# Patient Record
Sex: Male | Born: 1945 | Race: White | Hispanic: No | Marital: Married | Smoking: Never smoker
Health system: Southern US, Community
[De-identification: ages and names within clinical notes are randomized; demographics above are authoritative.]

## PROBLEM LIST (undated history)

## (undated) DIAGNOSIS — R0602 Shortness of breath: Secondary | ICD-10-CM

## (undated) DIAGNOSIS — I77810 Thoracic aortic ectasia: Secondary | ICD-10-CM

## (undated) DIAGNOSIS — N400 Enlarged prostate without lower urinary tract symptoms: Secondary | ICD-10-CM

## (undated) DIAGNOSIS — R972 Elevated prostate specific antigen [PSA]: Secondary | ICD-10-CM

## (undated) DIAGNOSIS — E119 Type 2 diabetes mellitus without complications: Secondary | ICD-10-CM

## (undated) DIAGNOSIS — M109 Gout, unspecified: Secondary | ICD-10-CM

## (undated) DIAGNOSIS — R161 Splenomegaly, not elsewhere classified: Secondary | ICD-10-CM

## (undated) DIAGNOSIS — D500 Iron deficiency anemia secondary to blood loss (chronic): Secondary | ICD-10-CM

## (undated) DIAGNOSIS — I639 Cerebral infarction, unspecified: Secondary | ICD-10-CM

## (undated) DIAGNOSIS — Z8673 Personal history of transient ischemic attack (TIA), and cerebral infarction without residual deficits: Secondary | ICD-10-CM

## (undated) DIAGNOSIS — K219 Gastro-esophageal reflux disease without esophagitis: Secondary | ICD-10-CM

## (undated) DIAGNOSIS — M1990 Unspecified osteoarthritis, unspecified site: Secondary | ICD-10-CM

## (undated) DIAGNOSIS — K5900 Constipation, unspecified: Secondary | ICD-10-CM

## (undated) DIAGNOSIS — I10 Essential (primary) hypertension: Secondary | ICD-10-CM

## (undated) DIAGNOSIS — D75839 Thrombocytosis, unspecified: Secondary | ICD-10-CM

## (undated) DIAGNOSIS — E785 Hyperlipidemia, unspecified: Secondary | ICD-10-CM

## (undated) DIAGNOSIS — N529 Male erectile dysfunction, unspecified: Secondary | ICD-10-CM

## (undated) DIAGNOSIS — B370 Candidal stomatitis: Secondary | ICD-10-CM

## (undated) DIAGNOSIS — R739 Hyperglycemia, unspecified: Secondary | ICD-10-CM

## (undated) DIAGNOSIS — R7989 Other specified abnormal findings of blood chemistry: Secondary | ICD-10-CM

## (undated) DIAGNOSIS — Z8739 Personal history of other diseases of the musculoskeletal system and connective tissue: Secondary | ICD-10-CM

## (undated) HISTORY — DX: Personal history of transient ischemic attack (TIA), and cerebral infarction without residual deficits: Z86.73

## (undated) HISTORY — DX: Type 2 diabetes mellitus without complications: E11.9

## (undated) HISTORY — DX: Thoracic aortic ectasia: I77.810

## (undated) HISTORY — DX: Thrombocytosis, unspecified: D75.839

## (undated) HISTORY — DX: Hyperglycemia, unspecified: R73.9

## (undated) HISTORY — DX: Elevated prostate specific antigen (PSA): R97.20

## (undated) HISTORY — DX: Benign prostatic hyperplasia without lower urinary tract symptoms: N40.0

## (undated) HISTORY — DX: Candidal stomatitis: B37.0

## (undated) HISTORY — DX: Hyperlipidemia, unspecified: E78.5

## (undated) HISTORY — DX: Iron deficiency anemia secondary to blood loss (chronic): D50.0

## (undated) HISTORY — PX: INGUINAL HERNIA REPAIR: SUR1180

## (undated) HISTORY — PX: OTHER SURGICAL HISTORY: SHX169

## (undated) HISTORY — DX: Gout, unspecified: M10.9

## (undated) HISTORY — DX: Splenomegaly, not elsewhere classified: R16.1

## (undated) HISTORY — DX: Male erectile dysfunction, unspecified: N52.9

---

## 2009-07-19 HISTORY — PX: COLOSTOMY: SHX63

## 2010-07-19 HISTORY — PX: COLOSTOMY REVERSAL: SHX5782

## 2011-12-10 DIAGNOSIS — E669 Obesity, unspecified: Secondary | ICD-10-CM | POA: Diagnosis not present

## 2012-01-03 DIAGNOSIS — K432 Incisional hernia without obstruction or gangrene: Secondary | ICD-10-CM | POA: Diagnosis not present

## 2012-01-03 DIAGNOSIS — K602 Anal fissure, unspecified: Secondary | ICD-10-CM | POA: Diagnosis not present

## 2012-01-24 DIAGNOSIS — K432 Incisional hernia without obstruction or gangrene: Secondary | ICD-10-CM | POA: Diagnosis not present

## 2012-01-24 DIAGNOSIS — E669 Obesity, unspecified: Secondary | ICD-10-CM | POA: Diagnosis not present

## 2012-01-26 DIAGNOSIS — K430 Incisional hernia with obstruction, without gangrene: Secondary | ICD-10-CM | POA: Diagnosis not present

## 2012-01-26 DIAGNOSIS — N400 Enlarged prostate without lower urinary tract symptoms: Secondary | ICD-10-CM | POA: Diagnosis not present

## 2012-01-26 DIAGNOSIS — I10 Essential (primary) hypertension: Secondary | ICD-10-CM | POA: Diagnosis not present

## 2012-01-26 DIAGNOSIS — Z79899 Other long term (current) drug therapy: Secondary | ICD-10-CM | POA: Diagnosis not present

## 2012-01-26 DIAGNOSIS — K432 Incisional hernia without obstruction or gangrene: Secondary | ICD-10-CM | POA: Diagnosis not present

## 2012-01-26 DIAGNOSIS — E669 Obesity, unspecified: Secondary | ICD-10-CM | POA: Diagnosis not present

## 2012-01-26 DIAGNOSIS — Z9852 Vasectomy status: Secondary | ICD-10-CM | POA: Diagnosis not present

## 2012-05-19 DIAGNOSIS — M109 Gout, unspecified: Secondary | ICD-10-CM | POA: Diagnosis not present

## 2012-05-19 DIAGNOSIS — I10 Essential (primary) hypertension: Secondary | ICD-10-CM | POA: Diagnosis not present

## 2012-05-19 DIAGNOSIS — Z125 Encounter for screening for malignant neoplasm of prostate: Secondary | ICD-10-CM | POA: Diagnosis not present

## 2012-05-19 DIAGNOSIS — N529 Male erectile dysfunction, unspecified: Secondary | ICD-10-CM | POA: Diagnosis not present

## 2012-05-19 DIAGNOSIS — M25569 Pain in unspecified knee: Secondary | ICD-10-CM | POA: Diagnosis not present

## 2012-05-19 DIAGNOSIS — E785 Hyperlipidemia, unspecified: Secondary | ICD-10-CM | POA: Diagnosis not present

## 2012-05-19 DIAGNOSIS — K649 Unspecified hemorrhoids: Secondary | ICD-10-CM | POA: Diagnosis not present

## 2012-06-19 DIAGNOSIS — E291 Testicular hypofunction: Secondary | ICD-10-CM | POA: Diagnosis not present

## 2012-07-11 DIAGNOSIS — Z79899 Other long term (current) drug therapy: Secondary | ICD-10-CM | POA: Diagnosis not present

## 2012-07-11 DIAGNOSIS — Z23 Encounter for immunization: Secondary | ICD-10-CM | POA: Diagnosis not present

## 2012-07-11 DIAGNOSIS — Z8719 Personal history of other diseases of the digestive system: Secondary | ICD-10-CM | POA: Diagnosis not present

## 2012-07-11 DIAGNOSIS — M109 Gout, unspecified: Secondary | ICD-10-CM | POA: Diagnosis not present

## 2012-07-11 DIAGNOSIS — I6350 Cerebral infarction due to unspecified occlusion or stenosis of unspecified cerebral artery: Secondary | ICD-10-CM | POA: Diagnosis not present

## 2012-07-11 DIAGNOSIS — I369 Nonrheumatic tricuspid valve disorder, unspecified: Secondary | ICD-10-CM | POA: Diagnosis not present

## 2012-07-11 DIAGNOSIS — R5383 Other fatigue: Secondary | ICD-10-CM | POA: Diagnosis not present

## 2012-07-11 DIAGNOSIS — G459 Transient cerebral ischemic attack, unspecified: Secondary | ICD-10-CM | POA: Diagnosis not present

## 2012-07-11 DIAGNOSIS — I639 Cerebral infarction, unspecified: Secondary | ICD-10-CM

## 2012-07-11 DIAGNOSIS — M1710 Unilateral primary osteoarthritis, unspecified knee: Secondary | ICD-10-CM | POA: Diagnosis not present

## 2012-07-11 DIAGNOSIS — N400 Enlarged prostate without lower urinary tract symptoms: Secondary | ICD-10-CM | POA: Diagnosis not present

## 2012-07-11 DIAGNOSIS — Z9049 Acquired absence of other specified parts of digestive tract: Secondary | ICD-10-CM | POA: Diagnosis not present

## 2012-07-11 DIAGNOSIS — I10 Essential (primary) hypertension: Secondary | ICD-10-CM | POA: Diagnosis not present

## 2012-07-11 DIAGNOSIS — R809 Proteinuria, unspecified: Secondary | ICD-10-CM | POA: Diagnosis not present

## 2012-07-11 DIAGNOSIS — M1990 Unspecified osteoarthritis, unspecified site: Secondary | ICD-10-CM | POA: Diagnosis not present

## 2012-07-11 DIAGNOSIS — R5381 Other malaise: Secondary | ICD-10-CM | POA: Diagnosis not present

## 2012-07-11 DIAGNOSIS — I6330 Cerebral infarction due to thrombosis of unspecified cerebral artery: Secondary | ICD-10-CM | POA: Diagnosis not present

## 2012-07-11 DIAGNOSIS — Z7982 Long term (current) use of aspirin: Secondary | ICD-10-CM | POA: Diagnosis not present

## 2012-07-11 DIAGNOSIS — R209 Unspecified disturbances of skin sensation: Secondary | ICD-10-CM | POA: Diagnosis not present

## 2012-07-11 DIAGNOSIS — E7800 Pure hypercholesterolemia, unspecified: Secondary | ICD-10-CM | POA: Diagnosis not present

## 2012-07-11 HISTORY — DX: Cerebral infarction, unspecified: I63.9

## 2012-07-26 DIAGNOSIS — I10 Essential (primary) hypertension: Secondary | ICD-10-CM | POA: Diagnosis not present

## 2012-07-26 DIAGNOSIS — I6350 Cerebral infarction due to unspecified occlusion or stenosis of unspecified cerebral artery: Secondary | ICD-10-CM | POA: Diagnosis not present

## 2012-07-26 DIAGNOSIS — E291 Testicular hypofunction: Secondary | ICD-10-CM | POA: Diagnosis not present

## 2012-07-26 DIAGNOSIS — Z23 Encounter for immunization: Secondary | ICD-10-CM | POA: Diagnosis not present

## 2012-07-26 DIAGNOSIS — Z6836 Body mass index (BMI) 36.0-36.9, adult: Secondary | ICD-10-CM | POA: Diagnosis not present

## 2012-09-06 DIAGNOSIS — M999 Biomechanical lesion, unspecified: Secondary | ICD-10-CM | POA: Diagnosis not present

## 2012-09-07 DIAGNOSIS — M999 Biomechanical lesion, unspecified: Secondary | ICD-10-CM | POA: Diagnosis not present

## 2012-09-11 DIAGNOSIS — M999 Biomechanical lesion, unspecified: Secondary | ICD-10-CM | POA: Diagnosis not present

## 2012-09-12 DIAGNOSIS — M1710 Unilateral primary osteoarthritis, unspecified knee: Secondary | ICD-10-CM | POA: Diagnosis not present

## 2012-09-12 DIAGNOSIS — M999 Biomechanical lesion, unspecified: Secondary | ICD-10-CM | POA: Diagnosis not present

## 2012-09-14 DIAGNOSIS — M999 Biomechanical lesion, unspecified: Secondary | ICD-10-CM | POA: Diagnosis not present

## 2012-10-19 DIAGNOSIS — I10 Essential (primary) hypertension: Secondary | ICD-10-CM | POA: Diagnosis not present

## 2012-10-19 DIAGNOSIS — N401 Enlarged prostate with lower urinary tract symptoms: Secondary | ICD-10-CM | POA: Diagnosis not present

## 2012-10-19 DIAGNOSIS — E785 Hyperlipidemia, unspecified: Secondary | ICD-10-CM | POA: Diagnosis not present

## 2012-10-19 DIAGNOSIS — E039 Hypothyroidism, unspecified: Secondary | ICD-10-CM | POA: Diagnosis not present

## 2012-10-24 DIAGNOSIS — M1710 Unilateral primary osteoarthritis, unspecified knee: Secondary | ICD-10-CM | POA: Diagnosis not present

## 2013-02-02 DIAGNOSIS — N529 Male erectile dysfunction, unspecified: Secondary | ICD-10-CM | POA: Diagnosis not present

## 2013-02-02 DIAGNOSIS — M25569 Pain in unspecified knee: Secondary | ICD-10-CM | POA: Diagnosis not present

## 2013-02-02 DIAGNOSIS — N401 Enlarged prostate with lower urinary tract symptoms: Secondary | ICD-10-CM | POA: Diagnosis not present

## 2013-02-02 DIAGNOSIS — I10 Essential (primary) hypertension: Secondary | ICD-10-CM | POA: Diagnosis not present

## 2013-02-02 DIAGNOSIS — E785 Hyperlipidemia, unspecified: Secondary | ICD-10-CM | POA: Diagnosis not present

## 2013-05-07 DIAGNOSIS — Z23 Encounter for immunization: Secondary | ICD-10-CM | POA: Diagnosis not present

## 2013-05-07 DIAGNOSIS — I6350 Cerebral infarction due to unspecified occlusion or stenosis of unspecified cerebral artery: Secondary | ICD-10-CM | POA: Diagnosis not present

## 2013-05-07 DIAGNOSIS — E785 Hyperlipidemia, unspecified: Secondary | ICD-10-CM | POA: Diagnosis not present

## 2013-05-07 DIAGNOSIS — I10 Essential (primary) hypertension: Secondary | ICD-10-CM | POA: Diagnosis not present

## 2013-05-07 DIAGNOSIS — E038 Other specified hypothyroidism: Secondary | ICD-10-CM | POA: Diagnosis not present

## 2013-06-27 DIAGNOSIS — M1610 Unilateral primary osteoarthritis, unspecified hip: Secondary | ICD-10-CM | POA: Diagnosis not present

## 2013-06-27 DIAGNOSIS — M1710 Unilateral primary osteoarthritis, unspecified knee: Secondary | ICD-10-CM | POA: Diagnosis not present

## 2013-06-27 DIAGNOSIS — M169 Osteoarthritis of hip, unspecified: Secondary | ICD-10-CM | POA: Diagnosis not present

## 2013-06-27 DIAGNOSIS — IMO0002 Reserved for concepts with insufficient information to code with codable children: Secondary | ICD-10-CM | POA: Diagnosis not present

## 2013-07-24 DIAGNOSIS — L820 Inflamed seborrheic keratosis: Secondary | ICD-10-CM | POA: Diagnosis not present

## 2013-07-31 DIAGNOSIS — E785 Hyperlipidemia, unspecified: Secondary | ICD-10-CM | POA: Diagnosis not present

## 2013-07-31 DIAGNOSIS — M1610 Unilateral primary osteoarthritis, unspecified hip: Secondary | ICD-10-CM | POA: Diagnosis not present

## 2013-07-31 DIAGNOSIS — Z01818 Encounter for other preprocedural examination: Secondary | ICD-10-CM | POA: Diagnosis not present

## 2013-07-31 DIAGNOSIS — I10 Essential (primary) hypertension: Secondary | ICD-10-CM | POA: Diagnosis not present

## 2013-08-06 ENCOUNTER — Encounter (HOSPITAL_COMMUNITY): Payer: Self-pay | Admitting: Pharmacy Technician

## 2013-08-08 ENCOUNTER — Encounter (HOSPITAL_COMMUNITY)
Admission: RE | Admit: 2013-08-08 | Discharge: 2013-08-08 | Disposition: A | Discharge status: Home / Self Care | Payer: Medicare Other | Source: Ambulatory Visit | Attending: Orthopedic Surgery | Admitting: Orthopedic Surgery

## 2013-08-08 ENCOUNTER — Ambulatory Visit (HOSPITAL_COMMUNITY)
Admission: RE | Admit: 2013-08-08 | Discharge: 2013-08-08 | Disposition: A | Discharge status: Home / Self Care | Payer: Medicare Other | Source: Ambulatory Visit | Attending: Orthopedic Surgery | Admitting: Orthopedic Surgery

## 2013-08-08 ENCOUNTER — Encounter (HOSPITAL_COMMUNITY): Payer: Self-pay

## 2013-08-08 DIAGNOSIS — M1610 Unilateral primary osteoarthritis, unspecified hip: Secondary | ICD-10-CM | POA: Diagnosis not present

## 2013-08-08 DIAGNOSIS — R911 Solitary pulmonary nodule: Secondary | ICD-10-CM | POA: Diagnosis not present

## 2013-08-08 DIAGNOSIS — M47814 Spondylosis without myelopathy or radiculopathy, thoracic region: Secondary | ICD-10-CM | POA: Diagnosis not present

## 2013-08-08 DIAGNOSIS — Z0183 Encounter for blood typing: Secondary | ICD-10-CM | POA: Insufficient documentation

## 2013-08-08 DIAGNOSIS — Z01818 Encounter for other preprocedural examination: Secondary | ICD-10-CM | POA: Diagnosis not present

## 2013-08-08 DIAGNOSIS — M169 Osteoarthritis of hip, unspecified: Secondary | ICD-10-CM | POA: Diagnosis not present

## 2013-08-08 DIAGNOSIS — Z01812 Encounter for preprocedural laboratory examination: Secondary | ICD-10-CM | POA: Insufficient documentation

## 2013-08-08 DIAGNOSIS — J8410 Pulmonary fibrosis, unspecified: Secondary | ICD-10-CM | POA: Diagnosis not present

## 2013-08-08 HISTORY — DX: Unspecified osteoarthritis, unspecified site: M19.90

## 2013-08-08 HISTORY — DX: Benign prostatic hyperplasia without lower urinary tract symptoms: N40.0

## 2013-08-08 HISTORY — DX: Personal history of other diseases of the musculoskeletal system and connective tissue: Z87.39

## 2013-08-08 HISTORY — DX: Other specified abnormal findings of blood chemistry: R79.89

## 2013-08-08 HISTORY — DX: Constipation, unspecified: K59.00

## 2013-08-08 HISTORY — DX: Shortness of breath: R06.02

## 2013-08-08 HISTORY — DX: Cerebral infarction, unspecified: I63.9

## 2013-08-08 HISTORY — DX: Gastro-esophageal reflux disease without esophagitis: K21.9

## 2013-08-08 HISTORY — DX: Essential (primary) hypertension: I10

## 2013-08-08 LAB — ABO/RH: ABO/RH(D): O NEG

## 2013-08-08 LAB — SURGICAL PCR SCREEN
MRSA, PCR: NEGATIVE
Staphylococcus aureus: NEGATIVE

## 2013-08-08 LAB — PROTIME-INR
INR: 0.92 (ref 0.00–1.49)
Prothrombin Time: 12.2 seconds (ref 11.6–15.2)

## 2013-08-08 LAB — APTT: aPTT: 28 seconds (ref 24–37)

## 2013-08-08 NOTE — Progress Notes (Signed)
CBC / BMET / UA / EKG on chart from [facility].

## 2013-08-08 NOTE — Patient Instructions (Addendum)
William [name]  08/08/2013                           YOUR PROCEDURE IS SCHEDULED ON: 08/14/13               PLEASE REPORT TO SHORT STAY CENTER AT : 12:00 PM               CALL THIS NUMBER IF ANY PROBLEMS THE DAY OF SURGERY :               [phone]                      REMEMBER:   Do not eat food or drink liquids AFTER MIDNIGHT  May have clear liquids UNTIL 6 HOURS BEFORE SURGERY (9:00 AM)  Clear liquids include soda, tea, black coffee, apple or grape juice, broth.  Take these medicines the morning of surgery with A SIP OF WATER:  AMLODIPINE / METOPROLOL   Do not wear jewelry, make-up   Do not wear lotions, powders, or perfumes.   Do not shave legs or underarms 12 hrs. before surgery (men may shave face)  Do not bring valuables to the hospital.  Contacts, dentures or bridgework may not be worn into surgery.  Leave suitcase in the car. After surgery it may be brought to your room.  For patients admitted to the hospital more than one night, checkout time is 11:00                          The day of discharge.   Patients discharged the day of surgery will not be allowed to drive home                             If going home same day of surgery, must have someone stay with you first                           24 hrs at home and arrange for some one to drive you home from hospital.    Special Instructions:   Please read over the following fact sheets that you were given:               1. MRSA  INFORMATION                      2. [place]               3. [place]                                                X_____________________________________________________________________        Failure to follow these instructions may result in cancellation of your surgery

## 2013-08-08 NOTE — Progress Notes (Signed)
08/08/13 1123  OBSTRUCTIVE SLEEP APNEA  Have you ever been diagnosed with sleep apnea through a sleep study? No  Do you snore loudly (loud enough to be heard through closed doors)?  1  Do you often feel tired, fatigued, or sleepy during the daytime? 1  Has anyone observed you stop breathing during your sleep? 1  Do you have, or are you being treated for high blood pressure? 1  BMI more than 35 kg/m2? 1  Age over 67 years old? 1  Neck circumference greater than 40 cm/18 inches? 0  Gender: 1  Obstructive Sleep Apnea Score 7  Score 4 or greater  Results sent to PCP

## 2013-08-08 NOTE — H&P (Signed)
TOTAL HIP ADMISSION H&P  Patient is admitted for right total hip arthroplasty, anterior approach.  Subjective:  Chief Complaint:      Right hip OA / pain  HPI: William [name], 68 y.o. male, has a history of pain and functional disability in the right hip(s) due to arthritis and patient has failed non-surgical conservative treatments for greater than 12 weeks to include NSAID's and/or analgesics, use of assistive devices and activity modification.  Onset of symptoms was gradual starting 1+ years ago with gradually worsening course since that time.The patient noted no past surgery on the right hip(s).  Patient currently rates pain in the right hip at 7 out of 10 with activity. Patient has worsening of pain with activity and weight bearing, trendelenberg gait, pain that interfers with activities of daily living, pain with passive range of motion and crepitus. Patient has evidence of periarticular osteophytes and joint space narrowing by imaging studies. This condition presents safety issues increasing the risk of falls. There is no current active infection.  Risks, benefits and expectations were discussed with the patient.  Risks including but not limited to the risk of anesthesia, blood clots, nerve damage, blood vessel damage, failure of the prosthesis, infection and up to and including death.  Patient understand the risks, benefits and expectations and wishes to proceed with surgery.   D/C Plans:   Home with HHPT  Post-op Meds:     No Rx given   Tranexamic Acid:     Not to be given - CAD  Decadron:    To be given  FYI:    ASA post-op  Norco post-op    Past Medical History  Diagnosis Date  . Hypertension   . Shortness of breath     WITH EXERTION  . 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 Arthritis   . History of gout   . GERD (gastroesophageal reflux disease)   . Constipation   . BPH (benign prostatic hyperplasia)   . Low testosterone     Past  Surgical History  Procedure Laterality Date  . 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 Colostomy reversal  2012  . Inghernia    . Inguinal hernia repair      No prescriptions prior to admission   No Known Allergies   History  Substance Use Topics  . Smoking status: Never Smoker   . Smokeless tobacco: Not on file  . Alcohol Use: No    No family history on file.   Review of Systems  Constitutional: Negative.   HENT: Negative.   Eyes: Negative.   Respiratory: Positive for shortness of breath (on exertion).   Cardiovascular: Negative.   Gastrointestinal: Positive for heartburn and constipation.  Genitourinary: Negative.   Musculoskeletal: Positive for joint pain.  Skin: Negative.   Neurological: Negative.   Endo/Heme/Allergies: Negative.   Psychiatric/Behavioral: Negative.     Objective:  Physical Exam  Constitutional: He is oriented to person, place, and time. He appears well-developed and well-nourished.  HENT:  Head: Normocephalic and atraumatic.  Mouth/Throat: Oropharynx is clear and moist.  Eyes: Pupils are equal, round, and reactive to light.  Neck: Neck supple. No JVD present. No tracheal deviation present. No thyromegaly present.  Cardiovascular: Normal rate, regular rhythm, normal heart sounds and intact distal pulses.   Respiratory: Effort normal and breath sounds normal. No stridor. No respiratory distress. He has no wheezes.  GI: Soft. There is no  tenderness. There is no guarding.  Musculoskeletal:       Right hip: He exhibits decreased range of motion, decreased strength, tenderness and bony tenderness. He exhibits no swelling, no deformity and no laceration.  Lymphadenopathy:    He has no cervical adenopathy.  Neurological: He is alert and oriented to person, place, and time.  Skin: Skin is warm and dry.  Psychiatric: He has a normal mood and affect.    Vital signs in last 24 hours: Temp:  [97.6 F (36.4 C)] 97.6 F (36.4 C) (01/21 1103) Pulse  Rate:  [67] 67 (01/21 1103) Resp:  [16] 16 (01/21 1103) BP: (140)/(86) 140/86 mmHg (01/21 1103) SpO2:  [95 %] 95 % (01/21 1103) Weight:  [90.266 kg (199 lb)] 90.266 kg (199 lb) (01/21 1103)   Imaging Review Plain radiographs demonstrate severe degenerative joint disease of the right hip(s). The bone quality appears to be good for age and reported activity level.  Assessment/Plan:  End stage arthritis, right hip(s)  The patient history, physical examination, clinical judgement of the provider and imaging studies are consistent with end stage degenerative joint disease of the right hip(s) and total hip arthroplasty is deemed medically necessary. The treatment options including medical management, injection therapy, arthroscopy and arthroplasty were discussed at length. The risks and benefits of total hip arthroplasty were presented and reviewed. The risks due to aseptic loosening, infection, stiffness, dislocation/subluxation,  thromboembolic complications and other imponderables were discussed.  The patient acknowledged the explanation, agreed to proceed with the plan and consent was signed. Patient is being admitted for inpatient treatment for surgery, pain control, PT, OT, prophylactic antibiotics, VTE prophylaxis, progressive ambulation and ADL's and discharge planning.The patient is planning to be discharged home with home health services.    [place] Pugh Bird Swetz   PAC  08/08/2013, 3:31 PM

## 2013-08-14 ENCOUNTER — Encounter (HOSPITAL_COMMUNITY): Payer: Medicare Other | Admitting: Registered Nurse

## 2013-08-14 ENCOUNTER — Inpatient Hospital Stay (HOSPITAL_COMMUNITY): Payer: Medicare Other

## 2013-08-14 ENCOUNTER — Encounter (HOSPITAL_COMMUNITY)
Admission: RE | Disposition: A | Discharge status: Home Health | Payer: Self-pay | Source: Ambulatory Visit | Attending: Orthopedic Surgery

## 2013-08-14 ENCOUNTER — Inpatient Hospital Stay (HOSPITAL_COMMUNITY): Payer: Medicare Other | Admitting: Registered Nurse

## 2013-08-14 ENCOUNTER — Inpatient Hospital Stay (HOSPITAL_COMMUNITY)
Admission: RE | Admit: 2013-08-14 | Discharge: 2013-08-15 | DRG: 470 | Disposition: A | Discharge status: Home Health | Payer: Medicare Other | Source: Ambulatory Visit | Attending: Orthopedic Surgery | Admitting: Orthopedic Surgery

## 2013-08-14 ENCOUNTER — Encounter (HOSPITAL_COMMUNITY): Payer: Self-pay | Admitting: *Deleted

## 2013-08-14 DIAGNOSIS — E669 Obesity, unspecified: Secondary | ICD-10-CM | POA: Diagnosis not present

## 2013-08-14 DIAGNOSIS — M1610 Unilateral primary osteoarthritis, unspecified hip: Principal | ICD-10-CM | POA: Diagnosis present

## 2013-08-14 DIAGNOSIS — Z96649 Presence of unspecified artificial hip joint: Secondary | ICD-10-CM

## 2013-08-14 DIAGNOSIS — K219 Gastro-esophageal reflux disease without esophagitis: Secondary | ICD-10-CM | POA: Diagnosis not present

## 2013-08-14 DIAGNOSIS — M25559 Pain in unspecified hip: Secondary | ICD-10-CM | POA: Diagnosis not present

## 2013-08-14 DIAGNOSIS — I10 Essential (primary) hypertension: Secondary | ICD-10-CM | POA: Diagnosis not present

## 2013-08-14 DIAGNOSIS — D62 Acute posthemorrhagic anemia: Secondary | ICD-10-CM | POA: Diagnosis not present

## 2013-08-14 DIAGNOSIS — I2510 Atherosclerotic heart disease of native coronary artery without angina pectoris: Secondary | ICD-10-CM | POA: Diagnosis present

## 2013-08-14 DIAGNOSIS — I69998 Other sequelae following unspecified cerebrovascular disease: Secondary | ICD-10-CM

## 2013-08-14 DIAGNOSIS — Z6836 Body mass index (BMI) 36.0-36.9, adult: Secondary | ICD-10-CM

## 2013-08-14 DIAGNOSIS — M169 Osteoarthritis of hip, unspecified: Secondary | ICD-10-CM | POA: Diagnosis not present

## 2013-08-14 DIAGNOSIS — Z471 Aftercare following joint replacement surgery: Secondary | ICD-10-CM | POA: Diagnosis not present

## 2013-08-14 DIAGNOSIS — R209 Unspecified disturbances of skin sensation: Secondary | ICD-10-CM | POA: Diagnosis present

## 2013-08-14 DIAGNOSIS — R0602 Shortness of breath: Secondary | ICD-10-CM | POA: Diagnosis not present

## 2013-08-14 HISTORY — PX: TOTAL HIP ARTHROPLASTY: SHX124

## 2013-08-14 HISTORY — DX: Presence of unspecified artificial hip joint: Z96.649

## 2013-08-14 LAB — TYPE AND SCREEN
ABO/RH(D): O NEG
Antibody Screen: NEGATIVE

## 2013-08-14 SURGERY — ARTHROPLASTY, HIP, TOTAL, ANTERIOR APPROACH
Anesthesia: Spinal | Site: Hip | Laterality: Right

## 2013-08-14 MED ORDER — PHENYLEPHRINE HCL 10 MG/ML IJ SOLN
INTRAMUSCULAR | Status: DC | PRN
  Administered 2013-08-14 (×3): 120 ug via INTRAVENOUS

## 2013-08-14 MED ORDER — FERROUS SULFATE 325 (65 FE) MG PO TABS
325.0000 mg | ORAL_TABLET | Freq: Three times a day (TID) | ORAL | Status: DC
  Administered 2013-08-15 (×2): 325 mg via ORAL
  Filled 2013-08-14 (×5): qty 1

## 2013-08-14 MED ORDER — FENTANYL CITRATE 0.05 MG/ML IJ SOLN
INTRAMUSCULAR | Status: AC
  Filled 2013-08-14: qty 2

## 2013-08-14 MED ORDER — ONDANSETRON HCL 4 MG/2ML IJ SOLN: 4.0000 mg | Freq: Four times a day (QID) | INTRAMUSCULAR | Status: DC | PRN

## 2013-08-14 MED ORDER — CEFAZOLIN SODIUM-DEXTROSE 2-3 GM-% IV SOLR
2.0000 g | Freq: Four times a day (QID) | INTRAVENOUS | Status: AC
  Administered 2013-08-14 – 2013-08-15 (×2): 2 g via INTRAVENOUS
  Filled 2013-08-14 (×2): qty 50

## 2013-08-14 MED ORDER — DEXAMETHASONE SODIUM PHOSPHATE 10 MG/ML IJ SOLN
10.0000 mg | Freq: Once | INTRAMUSCULAR | Status: DC
  Filled 2013-08-14: qty 1

## 2013-08-14 MED ORDER — PNEUMOCOCCAL VAC POLYVALENT 25 MCG/0.5ML IJ INJ
0.5000 mL | INJECTION | INTRAMUSCULAR | Status: DC
  Filled 2013-08-14 (×2): qty 0.5

## 2013-08-14 MED ORDER — HYDROMORPHONE HCL PF 1 MG/ML IJ SOLN
0.2500 mg | INTRAMUSCULAR | Status: DC | PRN
  Administered 2013-08-14 (×4): 0.5 mg via INTRAVENOUS

## 2013-08-14 MED ORDER — 0.9 % SODIUM CHLORIDE (POUR BTL) OPTIME
TOPICAL | Status: DC | PRN
  Administered 2013-08-14: 1000 mL

## 2013-08-14 MED ORDER — PROMETHAZINE HCL 25 MG/ML IJ SOLN: 6.2500 mg | INTRAMUSCULAR | Status: DC | PRN

## 2013-08-14 MED ORDER — CHLORHEXIDINE GLUCONATE 4 % EX LIQD: 60.0000 mL | Freq: Once | CUTANEOUS | Status: DC

## 2013-08-14 MED ORDER — AMLODIPINE BESYLATE 10 MG PO TABS
10.0000 mg | ORAL_TABLET | Freq: Every morning | ORAL | Status: DC
  Filled 2013-08-14: qty 1

## 2013-08-14 MED ORDER — LACTATED RINGERS IV SOLN
INTRAVENOUS | Status: DC | PRN
  Administered 2013-08-14 (×2): via INTRAVENOUS

## 2013-08-14 MED ORDER — SODIUM CHLORIDE 0.9 % IV SOLN
100.0000 mL/h | INTRAVENOUS | Status: DC
  Administered 2013-08-14 – 2013-08-15 (×2): 100 mL/h via INTRAVENOUS
  Filled 2013-08-14 (×9): qty 1000

## 2013-08-14 MED ORDER — METHOCARBAMOL 100 MG/ML IJ SOLN
500.0000 mg | Freq: Four times a day (QID) | INTRAVENOUS | Status: DC | PRN
  Administered 2013-08-14: 500 mg via INTRAVENOUS
  Filled 2013-08-14: qty 5

## 2013-08-14 MED ORDER — PROPOFOL 10 MG/ML IV BOLUS
INTRAVENOUS | Status: AC
  Filled 2013-08-14: qty 20

## 2013-08-14 MED ORDER — CEFAZOLIN SODIUM-DEXTROSE 2-3 GM-% IV SOLR
2.0000 g | INTRAVENOUS | Status: AC
  Administered 2013-08-14: 2 g via INTRAVENOUS

## 2013-08-14 MED ORDER — METOCLOPRAMIDE HCL 5 MG/ML IJ SOLN: 5.0000 mg | Freq: Three times a day (TID) | INTRAMUSCULAR | Status: DC | PRN

## 2013-08-14 MED ORDER — GLYCOPYRROLATE 0.2 MG/ML IJ SOLN
INTRAMUSCULAR | Status: AC
  Filled 2013-08-14: qty 1

## 2013-08-14 MED ORDER — LACTATED RINGERS IV SOLN
INTRAVENOUS | Status: DC
  Administered 2013-08-14: 16:00:00 via INTRAVENOUS

## 2013-08-14 MED ORDER — PHENYLEPHRINE HCL 10 MG/ML IJ SOLN
20.0000 mg | INTRAVENOUS | Status: DC | PRN
  Administered 2013-08-14: 10 ug/min via INTRAVENOUS

## 2013-08-14 MED ORDER — HYDROCODONE-ACETAMINOPHEN 7.5-325 MG PO TABS
1.0000 | ORAL_TABLET | ORAL | Status: DC
  Administered 2013-08-14 – 2013-08-15 (×5): 2 via ORAL
  Filled 2013-08-14 (×5): qty 2

## 2013-08-14 MED ORDER — ALUM & MAG HYDROXIDE-SIMETH 200-200-20 MG/5ML PO SUSP: 30.0000 mL | ORAL | Status: DC | PRN

## 2013-08-14 MED ORDER — HYDROMORPHONE HCL PF 1 MG/ML IJ SOLN: 0.5000 mg | INTRAMUSCULAR | Status: DC | PRN

## 2013-08-14 MED ORDER — MENTHOL 3 MG MT LOZG: 1.0000 | LOZENGE | OROMUCOSAL | Status: DC | PRN

## 2013-08-14 MED ORDER — ONDANSETRON HCL 4 MG PO TABS: 4.0000 mg | ORAL_TABLET | Freq: Four times a day (QID) | ORAL | Status: DC | PRN

## 2013-08-14 MED ORDER — METOCLOPRAMIDE HCL 10 MG PO TABS
5.0000 mg | ORAL_TABLET | Freq: Three times a day (TID) | ORAL | Status: DC | PRN
Start: 2013-08-14 — End: 2013-08-15

## 2013-08-14 MED ORDER — SIMVASTATIN 20 MG PO TABS
20.0000 mg | ORAL_TABLET | Freq: Every day | ORAL | Status: DC
  Filled 2013-08-14: qty 1

## 2013-08-14 MED ORDER — ONDANSETRON HCL 4 MG/2ML IJ SOLN
INTRAMUSCULAR | Status: AC
  Filled 2013-08-14: qty 2

## 2013-08-14 MED ORDER — METOPROLOL SUCCINATE ER 100 MG PO TB24
100.0000 mg | ORAL_TABLET | Freq: Two times a day (BID) | ORAL | Status: DC
  Administered 2013-08-15: 100 mg via ORAL
  Filled 2013-08-14 (×3): qty 1

## 2013-08-14 MED ORDER — PHENOL 1.4 % MT LIQD: 1.0000 | OROMUCOSAL | Status: DC | PRN

## 2013-08-14 MED ORDER — CEFAZOLIN SODIUM-DEXTROSE 2-3 GM-% IV SOLR
INTRAVENOUS | Status: AC
  Filled 2013-08-14: qty 50

## 2013-08-14 MED ORDER — MIDAZOLAM HCL 5 MG/5ML IJ SOLN
INTRAMUSCULAR | Status: DC | PRN
  Administered 2013-08-14: 2 mg via INTRAVENOUS

## 2013-08-14 MED ORDER — LACTATED RINGERS IV SOLN
INTRAVENOUS | Status: DC
  Administered 2013-08-14: 1000 mL via INTRAVENOUS

## 2013-08-14 MED ORDER — ALLOPURINOL 300 MG PO TABS
300.0000 mg | ORAL_TABLET | Freq: Every day | ORAL | Status: DC
  Administered 2013-08-14 – 2013-08-15 (×2): 300 mg via ORAL
  Filled 2013-08-14 (×2): qty 1

## 2013-08-14 MED ORDER — BISACODYL 10 MG RE SUPP: 10.0000 mg | Freq: Every day | RECTAL | Status: DC | PRN

## 2013-08-14 MED ORDER — ZOLPIDEM TARTRATE 5 MG PO TABS: 5.0000 mg | ORAL_TABLET | Freq: Every evening | ORAL | Status: DC | PRN

## 2013-08-14 MED ORDER — HYDROMORPHONE HCL PF 1 MG/ML IJ SOLN
INTRAMUSCULAR | Status: AC
  Filled 2013-08-14: qty 1

## 2013-08-14 MED ORDER — GLYCOPYRROLATE 0.2 MG/ML IJ SOLN
INTRAMUSCULAR | Status: DC | PRN
  Administered 2013-08-14: 0.2 mg via INTRAVENOUS

## 2013-08-14 MED ORDER — DOCUSATE SODIUM 100 MG PO CAPS
100.0000 mg | ORAL_CAPSULE | Freq: Two times a day (BID) | ORAL | Status: DC
  Administered 2013-08-14 – 2013-08-15 (×2): 100 mg via ORAL

## 2013-08-14 MED ORDER — ASPIRIN EC 325 MG PO TBEC
325.0000 mg | DELAYED_RELEASE_TABLET | Freq: Two times a day (BID) | ORAL | Status: DC
  Administered 2013-08-15: 325 mg via ORAL
  Filled 2013-08-14 (×3): qty 1

## 2013-08-14 MED ORDER — CELECOXIB 200 MG PO CAPS
200.0000 mg | ORAL_CAPSULE | Freq: Two times a day (BID) | ORAL | Status: DC
  Administered 2013-08-14 – 2013-08-15 (×2): 200 mg via ORAL
  Filled 2013-08-14 (×3): qty 1

## 2013-08-14 MED ORDER — DIPHENHYDRAMINE HCL 25 MG PO CAPS: 25.0000 mg | ORAL_CAPSULE | Freq: Four times a day (QID) | ORAL | Status: DC | PRN

## 2013-08-14 MED ORDER — DEXAMETHASONE SODIUM PHOSPHATE 10 MG/ML IJ SOLN
INTRAMUSCULAR | Status: AC
  Filled 2013-08-14: qty 1

## 2013-08-14 MED ORDER — MIDAZOLAM HCL 2 MG/2ML IJ SOLN
INTRAMUSCULAR | Status: AC
  Filled 2013-08-14: qty 2

## 2013-08-14 MED ORDER — FLEET ENEMA 7-19 GM/118ML RE ENEM: 1.0000 | ENEMA | Freq: Once | RECTAL | Status: AC | PRN

## 2013-08-14 MED ORDER — MEPERIDINE HCL 50 MG/ML IJ SOLN: 6.2500 mg | INTRAMUSCULAR | Status: DC | PRN

## 2013-08-14 MED ORDER — ONDANSETRON HCL 4 MG/2ML IJ SOLN
INTRAMUSCULAR | Status: DC | PRN
  Administered 2013-08-14: 4 mg via INTRAVENOUS

## 2013-08-14 MED ORDER — FENTANYL CITRATE 0.05 MG/ML IJ SOLN
INTRAMUSCULAR | Status: DC | PRN
  Administered 2013-08-14: 100 ug via INTRAVENOUS

## 2013-08-14 MED ORDER — DEXAMETHASONE SODIUM PHOSPHATE 10 MG/ML IJ SOLN
10.0000 mg | Freq: Once | INTRAMUSCULAR | Status: AC
  Administered 2013-08-14: 10 mg via INTRAVENOUS

## 2013-08-14 MED ORDER — POLYETHYLENE GLYCOL 3350 17 G PO PACK
17.0000 g | PACK | Freq: Two times a day (BID) | ORAL | Status: DC
  Administered 2013-08-14 – 2013-08-15 (×2): 17 g via ORAL

## 2013-08-14 MED ORDER — PROPOFOL INFUSION 10 MG/ML OPTIME
INTRAVENOUS | Status: DC | PRN
  Administered 2013-08-14: 50 ug/kg/min via INTRAVENOUS

## 2013-08-14 MED ORDER — TAMSULOSIN HCL 0.4 MG PO CAPS
0.4000 mg | ORAL_CAPSULE | Freq: Every day | ORAL | Status: DC
  Administered 2013-08-14 – 2013-08-15 (×2): 0.4 mg via ORAL
  Filled 2013-08-14 (×2): qty 1

## 2013-08-14 MED ORDER — METHOCARBAMOL 500 MG PO TABS
500.0000 mg | ORAL_TABLET | Freq: Four times a day (QID) | ORAL | Status: DC | PRN
  Administered 2013-08-15 (×2): 500 mg via ORAL
  Filled 2013-08-14 (×2): qty 1

## 2013-08-14 SURGICAL SUPPLY — 41 items
BAG ZIPLOCK 12X15 (MISCELLANEOUS) IMPLANT
BLADE SAW SGTL 18X1.27X75 (BLADE) ×2 IMPLANT
BLADE SAW SGTL 18X1.27X75MM (BLADE) ×1
CAPT HIP PF COP ×3 IMPLANT
CATH FOLEY 2WAY  5CC 16FR SIL (CATHETERS) ×2
CATH FOLEY 2WAY 5CC 16FR SIL (CATHETERS) ×1 IMPLANT
DERMABOND ADVANCED (GAUZE/BANDAGES/DRESSINGS) ×2
DERMABOND ADVANCED .7 DNX12 (GAUZE/BANDAGES/DRESSINGS) ×1 IMPLANT
DRAPE C-ARM 42X120 X-RAY (DRAPES) ×3 IMPLANT
DRAPE POUCH INSTRU U-SHP 10X18 (DRAPES) IMPLANT
DRAPE STERI IOBAN 125X83 (DRAPES) ×3 IMPLANT
DRAPE U-SHAPE 47X51 STRL (DRAPES) ×9 IMPLANT
DRSG AQUACEL AG ADV 3.5X10 (GAUZE/BANDAGES/DRESSINGS) ×3 IMPLANT
DRSG TEGADERM 4X4.75 (GAUZE/BANDAGES/DRESSINGS) IMPLANT
DURAPREP 26ML APPLICATOR (WOUND CARE) ×3 IMPLANT
ELECT BLADE TIP CTD 4 INCH (ELECTRODE) ×3 IMPLANT
ELECT REM PT RETURN 9FT ADLT (ELECTROSURGICAL) ×3
ELECTRODE REM PT RTRN 9FT ADLT (ELECTROSURGICAL) ×1 IMPLANT
EVACUATOR 1/8 PVC DRAIN (DRAIN) IMPLANT
FACESHIELD LNG OPTICON STERILE (SAFETY) ×12 IMPLANT
GAUZE SPONGE 2X2 8PLY STRL LF (GAUZE/BANDAGES/DRESSINGS) IMPLANT
GLOVE BIOGEL PI IND STRL 7.5 (GLOVE) ×1 IMPLANT
GLOVE BIOGEL PI IND STRL 8 (GLOVE) ×1 IMPLANT
GLOVE BIOGEL PI INDICATOR 7.5 (GLOVE) ×2
GLOVE BIOGEL PI INDICATOR 8 (GLOVE) ×2
GLOVE ECLIPSE 8.0 STRL XLNG CF (GLOVE) ×3 IMPLANT
GLOVE ORTHO TXT STRL SZ7.5 (GLOVE) ×6 IMPLANT
GOWN SPEC L3 XXLG W/TWL (GOWN DISPOSABLE) ×3 IMPLANT
GOWN STRL REUS W/TWL LRG LVL3 (GOWN DISPOSABLE) ×3 IMPLANT
KIT BASIN OR (CUSTOM PROCEDURE TRAY) ×3 IMPLANT
PACK TOTAL JOINT (CUSTOM PROCEDURE TRAY) ×3 IMPLANT
PADDING CAST COTTON 6X4 STRL (CAST SUPPLIES) ×3 IMPLANT
SPONGE GAUZE 2X2 STER 10/PKG (GAUZE/BANDAGES/DRESSINGS)
SUT MNCRL AB 4-0 PS2 18 (SUTURE) ×3 IMPLANT
SUT VIC AB 1 CT1 36 (SUTURE) ×9 IMPLANT
SUT VIC AB 2-0 CT1 27 (SUTURE) ×6
SUT VIC AB 2-0 CT1 TAPERPNT 27 (SUTURE) ×3 IMPLANT
SUT VLOC 180 0 24IN GS25 (SUTURE) ×3 IMPLANT
TOWEL OR 17X26 10 PK STRL BLUE (TOWEL DISPOSABLE) ×3 IMPLANT
TOWEL OR NON WOVEN STRL DISP B (DISPOSABLE) IMPLANT
TRAY FOLEY CATH 14FRSI W/METER (CATHETERS) IMPLANT

## 2013-08-14 NOTE — Anesthesia Postprocedure Evaluation (Signed)
  Anesthesia Post-op Note  Patient: William [name]  Procedure(s) Performed: Procedure(s) (LRB): RIGHT TOTAL HIP ARTHROPLASTY ANTERIOR APPROACH (Right)  Patient Location: PACU  Anesthesia Type: Spinal  Level of Consciousness: awake and alert   Airway and Oxygen Therapy: Patient Spontanous Breathing  Post-op Pain: mild  Post-op Assessment: Post-op Vital signs reviewed, Patient's Cardiovascular Status Stable, Respiratory Function Stable, Patent Airway and No signs of Nausea or vomiting  Last Vitals:  Filed Vitals:   08/14/13 1730  BP: 103/68  Pulse: 54  Temp:   Resp: 10    Post-op Vital Signs: stable   Complications: No apparent anesthesia complications

## 2013-08-14 NOTE — Anesthesia Procedure Notes (Addendum)
Spinal   Spinal  End time: 08/14/2013 1:54 PM Staffing CRNA/Resident: Geneveive Furness E Preanesthetic Checklist Completed: patient identified, site marked, surgical consent, pre-op evaluation, timeout performed, IV checked, risks and benefits discussed and monitors and equipment checked Spinal Block Patient position: sitting Prep: Betadine Patient monitoring: continuous pulse ox and blood pressure Approach: midline Location: L3-4 Injection technique: single-shot Needle Needle gauge: 22 G Assessment Sensory level: T8 Additional Notes  Spinal kit expiration checked and within dated. Good CSF flow x3 . Negative heme and negative paresthesia, Pt tolerated spinal well.

## 2013-08-14 NOTE — Anesthesia Preprocedure Evaluation (Addendum)
Anesthesia Evaluation  Patient identified by MRN, date of birth, ID band Patient awake    Reviewed: Allergy & Precautions, H&P , NPO status , Patient's Chart, lab work & pertinent test results  Airway Mallampati: II TM Distance: >3 FB Neck ROM: Full    Dental no notable dental hx.    Pulmonary shortness of breath,  breath sounds clear to auscultation  Pulmonary exam normal       Cardiovascular Exercise Tolerance: Good hypertension, Pt. on medications and Pt. on home beta blockers Rhythm:Regular Rate:Normal     Neuro/Psych Left face and left sided numbness. CVA, Residual Symptoms negative psych ROS   GI/Hepatic Neg liver ROS, GERD-  ,  Endo/Other  negative endocrine ROS  Renal/GU negative Renal ROS  negative genitourinary   Musculoskeletal negative musculoskeletal ROS (+)   Abdominal (+) + obese,   Peds negative pediatric ROS (+)  Hematology negative hematology ROS (+)   Anesthesia Other Findings   Reproductive/Obstetrics negative OB ROS                         Anesthesia Physical Anesthesia Plan  ASA: III  Anesthesia Plan: Spinal   Post-op Pain Management:    Induction: Intravenous  Airway Management Planned:   Additional Equipment:   Intra-op Plan:   Post-operative Plan: Extubation in OR  Informed Consent: I have reviewed the patients History and Physical, chart, labs and discussed the procedure including the risks, benefits and alternatives for the proposed anesthesia with the patient or authorized representative who has indicated his/her understanding and acceptance.   Dental advisory given  Plan Discussed with: CRNA  Anesthesia Plan Comments: (Discussed risks/benefits of spinal including headache, backache, failure, bleeding, infection, and nerve damage. Patient consents to spinal. Questions answered. Coagulation studies acceptable. Platelet count 211K)      Anesthesia  Quick Evaluation

## 2013-08-14 NOTE — Interval H&P Note (Signed)
History and Physical Interval Note:  08/14/2013 12:42 PM  William [name]  has presented today for surgery, with the diagnosis of OSTEOARTHRITIS RIGHT HIP  The various methods of treatment have been discussed with the patient and family. After consideration of risks, benefits and other options for treatment, the patient has consented to  Procedure(s): RIGHT TOTAL HIP ARTHROPLASTY ANTERIOR APPROACH (Right) as a surgical intervention .  The patient's history has been reviewed, patient examined, no change in status, stable for surgery.  I have reviewed the patient's chart and labs.  Questions were answered to the patient's satisfaction.     Mauri Pole

## 2013-08-14 NOTE — Op Note (Signed)
NAME:  William [name]                ACCOUNT NO.: [id number]      MEDICAL RECORD NO.: [id number]      FACILITY:  [facility]      PHYSICIAN:  Paralee Cancel D  DATE OF BIRTH:  03-04-1946     DATE OF PROCEDURE:  08/14/2013                                 OPERATIVE REPORT         PREOPERATIVE DIAGNOSIS: Right  hip osteoarthritis.      POSTOPERATIVE DIAGNOSIS:  Right hip osteoarthritis.      PROCEDURE:  Right total hip replacement through an anterior approach   utilizing DePuy THR system, component size 50mm pinnacle cup, a size 36+4 neutral   Altrex liner, a size 3 Hi Tri Lock stem with a 36+1.5 delta ceramic   ball.      SURGEON:  Pietro Cassis. Alvan Dame, M.D.      ASSISTANT:  Danae Orleans, PA-C      ANESTHESIA:  Spinal.      SPECIMENS:  None.      COMPLICATIONS:  None.      BLOOD LOSS:  400 cc     DRAINS:  None.      INDICATION OF THE PROCEDURE:  William [name] is a 68 y.o. male who had   presented to office for evaluation of right hip pain.  Radiographs revealed   progressive degenerative changes with bone-on-bone   articulation to the  hip joint.  The patient had painful limited range of   motion significantly affecting their overall quality of life.  The patient was failing to    respond to conservative measures, and at this point was ready   to proceed with more definitive measures.  The patient has noted progressive   degenerative changes in his hip, progressive problems and dysfunction   with regarding the hip prior to surgery.  Consent was obtained for   benefit of pain relief.  Specific risk of infection, DVT, component   failure, dislocation, need for revision surgery, as well discussion of   the anterior versus posterior approach were reviewed.  Consent was   obtained for benefit of anterior pain relief through an anterior   approach.      PROCEDURE IN DETAIL:  The patient was brought to operative theater.   Once adequate anesthesia,  preoperative antibiotics, 2gm Ancef administered.   The patient was positioned supine on the OSI Hanna table.  Once adequate   padding of boney process was carried out, we had predraped out the hip, and  used fluoroscopy to confirm orientation of the pelvis and position.      The right hip was then prepped and draped from proximal iliac crest to   mid thigh with shower curtain technique.      Time-out was performed identifying the patient, planned procedure, and   extremity.     An incision was then made 2 cm distal and lateral to the   anterior superior iliac spine extending over the orientation of the   tensor fascia lata muscle and sharp dissection was carried down to the   fascia of the muscle and protractor placed in the soft tissues.      The fascia was then incised.  The muscle belly was identified and swept  laterally and retractor placed along the superior neck.  Following   cauterization of the circumflex vessels and removing some pericapsular   fat, a second cobra retractor was placed on the inferior neck.  A third   retractor was placed on the anterior acetabulum after elevating the   anterior rectus.  A L-capsulotomy was along the line of the   superior neck to the trochanteric fossa, then extended proximally and   distally.  Tag sutures were placed and the retractors were then placed   intracapsular.  We then identified the trochanteric fossa and   orientation of my neck cut, confirmed this radiographically   and then made a neck osteotomy with the femur on traction.  The femoral   head was removed without difficulty or complication.  Traction was let   off and retractors were placed posterior and anterior around the   acetabulum.      The labrum and foveal tissue were debrided.  I began reaming with a 54mm   reamer and reamed up to 73mm reamer with good bony bed preparation and a 52   cup was chosen.  The final 45mm Pinnacle cup was then impacted under fluoroscopy  to  confirm the depth of penetration and orientation with respect to   abduction.  A screw was placed followed by the hole eliminator.  The final   36+4 neutral Altrex liner was impacted with good visualized rim fit.  The cup was positioned anatomically within the acetabular portion of the pelvis.      At this point, the femur was rolled at 80 degrees.  Further capsule was   released off the inferior aspect of the femoral neck.  I then   released the superior capsule proximally.  The hook was placed laterally   along the femur and elevated manually and held in position with the bed   hook.  The leg was then extended and adducted with the leg rolled to 100   degrees of external rotation.  Once the proximal femur was fully   exposed, I used a box osteotome to set orientation.  I then began   broaching with the starting chili pepper broach and passed this by hand and then broached up to 3.  With the 3 broach in place I chose a high offset neck and did a trial reduction.  The offset was appropriate, leg lengths   appeared to be equal, confirmed radiographically.   Given these findings, I went ahead and dislocated the hip, repositioned all   retractors and positioned the right hip in the extended and abducted position.  The final 3 Hi Tri Lock stem was   chosen and it was impacted down to the level of neck cut.  Based on this   and the trial reduction, a 36+1.5 delta ceramic ball was chosen and   impacted onto a clean and dry trunnion, and the hip was reduced.  The   hip had been irrigated throughout the case again at this point.  I did   reapproximate the superior capsular leaflet to the anterior leaflet   using #1 Vicryl.  The fascia of the   tensor fascia lata muscle was then reapproximated using #1 Vicryl and #0 V-lock sutures.  The   remaining wound was closed with 2-0 Vicryl and running 4-0 Monocryl.   The hip was cleaned, dried, and dressed sterilely using Dermabond and   Aquacel dressing.   She was then brought   to recovery room  in stable condition tolerating the procedure well.    Danae Orleans, PA-C was present for the entirety of the case involved from   preoperative positioning, perioperative retractor management, general   facilitation of the case, as well as primary wound closure as assistant.            Pietro Cassis Alvan Dame, M.D.        08/14/2013 3:53 PM

## 2013-08-14 NOTE — Transfer of Care (Signed)
Immediate Anesthesia Transfer of Care Note  Patient: William [name]  Procedure(s) Performed: Procedure(s): RIGHT TOTAL HIP ARTHROPLASTY ANTERIOR APPROACH (Right)  Patient Location: PACU  Anesthesia Type:Regional  Level of Consciousness: awake, alert  and oriented  Airway & Oxygen Therapy: Patient Spontanous Breathing and Patient connected to face mask oxygen  Post-op Assessment: Report given to PACU RN and Post -op Vital signs reviewed and stable  Post vital signs: Reviewed and stable  Complications: No apparent anesthesia complications

## 2013-08-15 ENCOUNTER — Encounter (HOSPITAL_COMMUNITY): Payer: Self-pay | Admitting: Orthopedic Surgery

## 2013-08-15 DIAGNOSIS — D62 Acute posthemorrhagic anemia: Secondary | ICD-10-CM

## 2013-08-15 DIAGNOSIS — E669 Obesity, unspecified: Secondary | ICD-10-CM | POA: Diagnosis present

## 2013-08-15 HISTORY — DX: Acute posthemorrhagic anemia: D62

## 2013-08-15 HISTORY — DX: Obesity, unspecified: E66.9

## 2013-08-15 LAB — CBC
HCT: 31.4 % — ABNORMAL LOW (ref 39.0–52.0)
Hemoglobin: 10.1 g/dL — ABNORMAL LOW (ref 13.0–17.0)
MCH: 27.3 pg (ref 26.0–34.0)
MCHC: 32.2 g/dL (ref 30.0–36.0)
MCV: 84.9 fL (ref 78.0–100.0)
PLATELETS: 173 10*3/uL (ref 150–400)
RBC: 3.7 MIL/uL — ABNORMAL LOW (ref 4.22–5.81)
RDW: 13.4 % (ref 11.5–15.5)
WBC: 10.5 10*3/uL (ref 4.0–10.5)

## 2013-08-15 LAB — BASIC METABOLIC PANEL
BUN: 13 mg/dL (ref 6–23)
CO2: 23 meq/L (ref 19–32)
Calcium: 8.3 mg/dL — ABNORMAL LOW (ref 8.4–10.5)
Chloride: 102 mEq/L (ref 96–112)
Creatinine, Ser: 1.01 mg/dL (ref 0.50–1.35)
GFR calc Af Amer: 87 mL/min — ABNORMAL LOW (ref >90)
GFR calc non Af Amer: 75 mL/min — ABNORMAL LOW (ref >90)
Glucose, Bld: 170 mg/dL — ABNORMAL HIGH (ref 70–99)
POTASSIUM: 4.3 meq/L (ref 3.7–5.3)
SODIUM: 136 meq/L — AB (ref 137–147)

## 2013-08-15 MED ORDER — ASPIRIN 325 MG PO TBEC: 325.0000 mg | DELAYED_RELEASE_TABLET | Freq: Two times a day (BID) | ORAL | Status: AC

## 2013-08-15 MED ORDER — TIZANIDINE HCL 4 MG PO TABS: 4.0000 mg | ORAL_TABLET | Freq: Four times a day (QID) | ORAL | Status: DC | PRN

## 2013-08-15 MED ORDER — DEXAMETHASONE 6 MG PO TABS
10.0000 mg | ORAL_TABLET | Freq: Every day | ORAL | Status: DC
  Administered 2013-08-15: 10 mg via ORAL
  Filled 2013-08-15 (×2): qty 1

## 2013-08-15 MED ORDER — HYDROCODONE-ACETAMINOPHEN 7.5-325 MG PO TABS: 1.0000 | ORAL_TABLET | ORAL | Status: DC | PRN

## 2013-08-15 MED ORDER — POLYETHYLENE GLYCOL 3350 17 G PO PACK: 17.0000 g | PACK | Freq: Two times a day (BID) | ORAL | Status: DC

## 2013-08-15 MED ORDER — DSS 100 MG PO CAPS: 100.0000 mg | ORAL_CAPSULE | Freq: Two times a day (BID) | ORAL | Status: DC

## 2013-08-15 MED ORDER — FERROUS SULFATE 325 (65 FE) MG PO TABS: 325.0000 mg | ORAL_TABLET | Freq: Three times a day (TID) | ORAL | Status: DC

## 2013-08-15 NOTE — Progress Notes (Signed)
Physical Therapy Treatment Patient Details Name: Nathaneal Sommers MRN: [id number] DOB: 1946-01-19 Today's Date: 08/15/2013 Time: 3664-4034 PT Time Calculation (min): 18 min  PT Assessment / Plan / Recommendation  History of Present Illness     PT Comments   Reviewed step up for high bed and car transfers with pt and spouse.  Follow Up Recommendations  Home health PT     Does the patient have the potential to tolerate intense rehabilitation     Barriers to Discharge        Equipment Recommendations  None recommended by PT    Recommendations for Other Services OT consult  Frequency 7X/week   Progress towards PT Goals Progress towards PT goals: Progressing toward goals  Plan Current plan remains appropriate    Precautions / Restrictions Precautions Precautions: Fall Restrictions Weight Bearing Restrictions: No Other Position/Activity Restrictions: WBAT   Pertinent Vitals/Pain Min c/o pain    Mobility  Bed Mobility Overal bed mobility: Modified Independent General bed mobility comments: min cues for use of L LE to self assist Transfers Overall transfer level: Modified independent Equipment used: Rolling Kling (2 wheeled) Transfers: Sit to/from Stand Sit to Stand: Modified independent (Device/Increase time) General transfer comment: min cues for transition position and use of UEs to self assist Ambulation/Gait Ambulation/Gait assistance: Min guard;Supervision Ambulation Distance (Feet): 400 Feet Assistive device: Rolling Knechtel (2 wheeled) Gait Pattern/deviations: Step-through pattern;Shuffle;Trunk flexed General Gait Details: cues for posture, position from RW and ER on R Stairs: Yes Stairs assistance: Min guard Stair Management: No rails Number of Stairs: 1 General stair comments: cues for sequence    Exercises Total Joint Exercises Ankle Circles/Pumps: AROM;Both;15 reps;Supine Quad Sets: AROM;Both;10 reps;Supine Heel Slides: AAROM;15 reps;Supine;Right Hip  ABduction/ADduction: AAROM;Right;15 reps;Supine   PT Diagnosis: Difficulty walking  PT Problem List: Decreased strength;Decreased range of motion;Decreased activity tolerance;Decreased mobility;Decreased knowledge of use of DME;Pain;Obesity PT Treatment Interventions: DME instruction;Gait training;Stair training;Functional mobility training;Therapeutic activities;Therapeutic exercise;Patient/family education   PT Goals (current goals can now be found in the care plan section) Acute Rehab PT Goals Patient Stated Goal: Resume previous lifestyle with decreased pain PT Goal Formulation: With patient Time For Goal Achievement: 08/23/13 Potential to Achieve Goals: Good  Visit Information  Last PT Received On: 08/15/13 Assistance Needed: +1    Subjective Data  Patient Stated Goal: Resume previous lifestyle with decreased pain   Cognition  Cognition Arousal/Alertness: Awake/alert Behavior During Therapy: WFL for tasks assessed/performed Overall Cognitive Status: Within Functional Limits for tasks assessed    Balance     End of Session PT - End of Session Equipment Utilized During Treatment: Gait belt Activity Tolerance: Patient tolerated treatment well Patient left: in chair;with call bell/phone within reach;with family/visitor present Nurse Communication: Mobility status   GP     Theodore Virgin 08/15/2013, 12:06 PM

## 2013-08-15 NOTE — Progress Notes (Signed)
UR completed 

## 2013-08-15 NOTE — Care Management Note (Signed)
    Page 1 of 2   08/15/2013     6:15:38 PM   CARE MANAGEMENT NOTE 08/15/2013  Patient:  William [name], William [name]   Account Number:  [id number]  Date Initiated:  08/15/2013  Documentation initiated by:  Sherrin Daisy  Subjective/Objective Assessment:   dx total rt hip replacement     Action/Plan:   CM spoke with patient and spouse. Plans are for patient to return to his home in [facility] where spouse will be caregiver. He needs rw> [facility] will provide [facility] services. HH will start tomorrow.   Anticipated DC Date:  08/15/2013   Anticipated DC Plan:  [place]  CM consult      PAC Choice  [place]   Choice offered to / List presented to:  C-1 Patient   DME arranged  Vassie Moselle      DME agency  [place] arranged  [place]   Status of service:  Completed, signed off Medicare Important Message given?  NA - LOS <3 / Initial given by admissions (If response is "NO", the following Medicare IM given date fields will be blank) Date Medicare IM given:   Date Additional Medicare IM given:    Discharge Disposition:  [place]  Per UR Regulation:    If discussed at Long Length of Stay Meetings, dates discussed:    Comments:

## 2013-08-15 NOTE — Progress Notes (Signed)
   Subjective: 1 Day Post-Op Procedure(s) (LRB): RIGHT TOTAL HIP ARTHROPLASTY ANTERIOR APPROACH (Right)   Patient reports pain as mild, pain controlled. No events throughout the night. Ready to be discharged home if pain stays controleld and does well with PT.   Objective:   VITALS:   Filed Vitals:   08/15/13  BP: 106/66  Pulse: 57  Temp: 98.2 F (36.8 C)   Resp: 16    Neurovascular intact Dorsiflexion/Plantar flexion intact Incision: dressing C/D/I No cellulitis present Compartment soft  LABS  Recent Labs  08/15/13 0353  HGB 10.1*  HCT 31.4*  WBC 10.5  PLT 173     Recent Labs  08/15/13 0353  NA 136*  K 4.3  BUN 13  CREATININE 1.01  GLUCOSE 170*     Assessment/Plan: 1 Day Post-Op Procedure(s) (LRB): RIGHT TOTAL HIP ARTHROPLASTY ANTERIOR APPROACH (Right) Foley cath d/c'ed Advance diet Up with therapy D/C IV fluids Discharge home with home health Follow up in 2 weeks at [facility]. Follow up with OLIN,Shatira Dobosz D in 2 weeks.  Contact information:  [facility] [address] [place] [phone]    Expected ABLA  Treated with iron and will observe  Obese (BMI 30-39.9) Estimated body mass index is 36.39 kg/(m^2) as calculated from the following:   Height as of this encounter: 5\' 2"  (1.575 m).   Weight as of this encounter: 90.266 kg (199 lb). Patient also counseled that weight may inhibit the healing process Patient counseled that losing weight will help with future health issues     West Pugh. Avanni Turnbaugh   PAC  08/15/2013, 10:12 AM

## 2013-08-15 NOTE — Evaluation (Signed)
Occupational Therapy Evaluation Patient Details Name: William [name] MRN: [id number] DOB: 04/22/1946 Today's Date: 08/15/2013 Time: 7829-5621 OT Time Calculation (min): 25 min  OT Assessment / Plan / Recommendation History of present illness Pt s/p R direct anterior THR    Clinical Impression   Pt doing well. All education completed. No DME needs.     OT Assessment  Patient does not need any further OT services    Follow Up Recommendations  No OT follow up;Supervision/Assistance - 24 hour    Barriers to Discharge      Equipment Recommendations  None recommended by OT    Recommendations for Other Services    Frequency       Precautions / Restrictions Precautions Precautions: Fall Restrictions Weight Bearing Restrictions: No Other Position/Activity Restrictions: WBAT   Pertinent Vitals/Pain Pt states no pain; "just sore"    ADL  Eating/Feeding: Simulated;Independent Where Assessed - Eating/Feeding: Chair Grooming: Simulated;Wash/dry hands;Set up Where Assessed - Grooming: Supported sitting Upper Body Bathing: Simulated;Chest;Right arm;Left arm;Abdomen;Set up Where Assessed - Upper Body Bathing: Unsupported sitting Lower Body Bathing: Simulated;Minimal assistance Where Assessed - Lower Body Bathing: Supported sit to stand Upper Body Dressing: Simulated;Set up Where Assessed - Upper Body Dressing: Unsupported sitting Lower Body Dressing: Simulated;Minimal assistance Where Assessed - Lower Body Dressing: Supported sit to stand Toilet Transfer: Performed;Min guard Science writer: Comfort height toilet Toileting - Water quality scientist and Hygiene: Simulated;Min guard Where Assessed - Best boy and Hygiene: Standing Tub/Shower Transfer: Simulated;Min guard;Minimal assistance Equipment Used: Rolling Jerez;Long-handled shoe horn;Long-handled sponge;Reacher;Sock aid ADL Comments: Pt states he is interested in AE because him and his wife  are on different schedules with trying to get her to help with LB self care until he is able. Demonstrated all AE and pt practiced with sock aid to don sock with supervision. Pt used reacher to adjust and pull up sock further. Pt verbalizes understanding of all Ae use and explained where to obtain AE. Encouraged pt to continue to work toward reaching down to feet also. Practiced shower transfer and he has a seat. he did well with reaching for edge of toilet seat to help descend to toilet and dont feel he needs 3in1. He states he may have one but feel higher toilet will be ok for pt.     OT Diagnosis:    OT Problem List:   OT Treatment Interventions:     OT Goals(Current goals can be found in the care plan section) Acute Rehab OT Goals Patient Stated Goal: Resume previous lifestyle with decreased pain  Visit Information  Last OT Received On: 08/15/13 Assistance Needed: +1 History of Present Illness: Pt s/p R direct anterior THR        Prior [place] expects to be discharged to:: Private residence Living Arrangements: Spouse/significant other Available Help at Discharge: Family Type of Home: House Home Access: Level entry [place]: One [place]: [place] - standard;Shower seat Additional Comments: pt not sure if he has a 3in1 Prior Function Level of Independence: Independent Communication Communication: No difficulties Dominant Hand: Right         Vision/Perception     Cognition  Cognition Arousal/Alertness: Awake/alert Behavior During Therapy: WFL for tasks assessed/performed Overall Cognitive Status: Within Functional Limits for tasks assessed    Extremity/Trunk Assessment Upper Extremity Assessment Upper Extremity Assessment: Overall WFL for tasks assessed     Mobility Transfers Overall transfer level: Needs assistance Equipment used: Rolling Schorr (2 wheeled) Transfers:  Sit to/from Stand Sit to Stand:  Supervision General transfer comment: verbal cues for hand placement      Exercise     Balance     End of Session OT - End of Session Equipment Utilized During Treatment: Rolling Proto Activity Tolerance: Patient tolerated treatment well Patient left: in chair;with call bell/phone within reach  GO     Jules Schick [phone] 08/15/2013, 2:06 PM

## 2013-08-15 NOTE — Evaluation (Signed)
Physical Therapy Evaluation Patient Details Name: William [name] MRN: [id number] DOB: [birth year] Today's Date: 08/15/2013 Time: 0931-1000 PT Time Calculation (min): 29 min  PT Assessment / Plan / Recommendation History of Present Illness     Clinical Impression  Pt s/p R THR presents with decreased R LE strength/ROM and post op pain limiting functional mobility.  Pt should progress well to d/c home with family assist and HHPT follow up.    PT Assessment  Patient needs continued PT services    Follow Up Recommendations  Home health PT    Does the patient have the potential to tolerate intense rehabilitation      Barriers to Discharge        Equipment Recommendations  None recommended by PT    Recommendations for Other Services OT consult   Frequency 7X/week    Precautions / Restrictions Precautions Precautions: Fall Restrictions Weight Bearing Restrictions: No Other Position/Activity Restrictions: WBAT   Pertinent Vitals/Pain 2/10; premed, ice pack provided.      Mobility  Bed Mobility Overal bed mobility: Modified Independent General bed mobility comments: min cues for use of L LE to self assist Transfers Overall transfer level: Needs assistance Equipment used: Rolling [place] (2 wheeled) Transfers: Sit to/from Stand Sit to Stand: Min guard General transfer comment: cues for LE management, use of UEs to self assist and saftey with return to chair - pt attempting to sit prior to stepping back to chair Ambulation/Gait Ambulation/Gait assistance: Min guard Ambulation Distance (Feet): 200 Feet Assistive device: Rolling [place] (2 wheeled) Gait Pattern/deviations: Step-to pattern;Step-through pattern;Shuffle;Trunk flexed General Gait Details: cues for posture and position from RW    Exercises Total Joint Exercises Ankle Circles/Pumps: AROM;Both;15 reps;Supine Quad Sets: AROM;Both;10 reps;Supine Heel Slides: AAROM;15 reps;Supine;Right Hip ABduction/ADduction:  AAROM;Right;15 reps;Supine   PT Diagnosis: Difficulty walking  PT Problem List: Decreased strength;Decreased range of motion;Decreased activity tolerance;Decreased mobility;Decreased knowledge of use of DME;Pain;Obesity PT Treatment Interventions: DME instruction;Gait training;Stair training;Functional mobility training;Therapeutic activities;Therapeutic exercise;Patient/family education     PT Goals(Current goals can be found in the care plan section) Acute Rehab PT Goals Patient Stated Goal: Resume previous lifestyle with decreased pain PT Goal Formulation: With patient Time For Goal Achievement: 08/23/13 Potential to Achieve Goals: Good  Visit Information  Last PT Received On: 08/15/13 Assistance Needed: +1       Prior [place] expects to be discharged to:: Private residence Living Arrangements: Spouse/significant other Available Help at Discharge: Family Type of Home: House Home Access: Level entry [place]: One [place]: Environmental consultant - standard Prior Function Level of Independence: Independent Communication Communication: No difficulties Dominant Hand: Right    Cognition  Cognition Arousal/Alertness: Awake/alert Behavior During Therapy: WFL for tasks assessed/performed Overall Cognitive Status: Within Functional Limits for tasks assessed    Extremity/Trunk Assessment Upper Extremity Assessment Upper Extremity Assessment: Overall WFL for tasks assessed Lower Extremity Assessment Lower Extremity Assessment: RLE deficits/detail RLE Deficits / Details: Hip strength 2/5 with AAROm at hip to 85 flex and 15 abd   Balance    End of Session PT - End of Session Equipment Utilized During Treatment: Gait belt Activity Tolerance: Patient tolerated treatment well Patient left: in chair;with call bell/phone within reach;with family/visitor present Nurse Communication: Mobility status  GP     William [name] 08/15/2013, 10:28  AM

## 2013-08-16 DIAGNOSIS — IMO0001 Reserved for inherently not codable concepts without codable children: Secondary | ICD-10-CM | POA: Diagnosis not present

## 2013-08-16 DIAGNOSIS — Z471 Aftercare following joint replacement surgery: Secondary | ICD-10-CM | POA: Diagnosis not present

## 2013-08-16 DIAGNOSIS — I10 Essential (primary) hypertension: Secondary | ICD-10-CM | POA: Diagnosis not present

## 2013-08-16 DIAGNOSIS — Z96649 Presence of unspecified artificial hip joint: Secondary | ICD-10-CM | POA: Diagnosis not present

## 2013-08-20 NOTE — Discharge Summary (Signed)
Physician Discharge Summary  Patient ID: William [name] MRN: [id number] DOB/AGE: 67-Jul-1947 67 y.o.  Admit date: 08/14/2013 Discharge date: 08/15/2013   Procedures:  Procedure(s) (LRB): RIGHT TOTAL HIP ARTHROPLASTY ANTERIOR APPROACH (Right)  Attending Physician:  Dr. Paralee Cancel   Admission Diagnoses:   Right hip OA / pain  Discharge Diagnoses:  Principal Problem:   S/P right THA, AA Active Problems:   Expected blood loss anemia   Obese  Past Medical History  Diagnosis Date  . Hypertension   . Shortness of breath     WITH EXERTION  . 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 Arthritis   . History of gout   . GERD (gastroesophageal reflux disease)   . Constipation   . BPH (benign prostatic hyperplasia)   . Low testosterone     HPI: William [name], 68 y.o. male, has a history of pain and functional disability in the right hip(s) due to arthritis and patient has failed non-surgical conservative treatments for greater than 12 weeks to include NSAID's and/or analgesics, use of assistive devices and activity modification. Onset of symptoms was gradual starting 1+ years ago with gradually worsening course since that time.The patient noted no past surgery on the right hip(s). Patient currently rates pain in the right hip at 7 out of 10 with activity. Patient has worsening of pain with activity and weight bearing, trendelenberg gait, pain that interfers with activities of daily living, pain with passive range of motion and crepitus. Patient has evidence of periarticular osteophytes and joint space narrowing by imaging studies. This condition presents safety issues increasing the risk of falls. There is no current active infection. Risks, benefits and expectations were discussed with the patient. Risks including but not limited to the risk of anesthesia, blood clots, nerve damage, blood vessel damage, failure of the prosthesis, infection and up to and  including death. Patient understand the risks, benefits and expectations and wishes to proceed with surgery.   PCP: Pcp Not In System   Discharged Condition: good  Hospital Course:  Patient underwent the above stated procedure on 08/14/2013. Patient tolerated the procedure well and brought to the recovery room in good condition and subsequently to the floor.  POD #1 BP: 106/66 ; Pulse: 57 ; Temp: 98.2 F (36.8 C) ; Resp: 16 Patient reports pain as mild, pain controlled. No events throughout the night. Ready to be discharged home.  Neurovascular intact, dorsiflexion/plantar flexion intact, incision: dressing C/D/I, no cellulitis present and compartment soft.   LABS  Basename    HGB  10.1  HCT  31.4    Discharge Exam: General appearance: alert, cooperative and no distress Extremities: Homans sign is negative, no sign of DVT, no edema, redness or tenderness in the calves or thighs and no ulcers, gangrene or trophic changes  Disposition:   Home-Health Care Svc with follow up in 2 weeks   Follow-up Information   Follow up with Mauri Pole, MD. Schedule an appointment as soon as possible for a visit in 2 weeks.   Specialty:  Orthopedic Surgery   Contact information:   [address] [place] [address] [phone]       Discharge Orders   Future Orders Complete By Expires   Call MD / Call 911  As directed    Comments:     If you experience chest pain or shortness of breath, CALL 911 and be transported to the hospital emergency room.  If you develope a fever above 101 F, pus (white drainage) or increased drainage or redness at the wound, or calf pain, call your surgeon's office.   Change dressing  As directed    Comments:     Maintain surgical dressing for 10-14 days, or until follow up in the clinic.   Constipation Prevention  As directed    Comments:     Drink plenty of fluids.  Prune juice may be helpful.  You may use a stool softener, such as Colace  (over the counter) 100 mg twice a day.  Use MiraLax (over the counter) for constipation as needed.   Diet - low sodium heart healthy  As directed    Discharge instructions  As directed    Comments:     Maintain surgical dressing for 10-14 days, or until follow up in the clinic. Follow up in 2 weeks at [facility]. Call with any questions or concerns.   Increase activity slowly as tolerated  As directed    TED hose  As directed    Comments:     Use stockings (TED hose) for 2 weeks on both leg(s).  You may remove them at night for sleeping.   Weight bearing as tolerated  As directed    Questions:     Laterality:     Extremity:          Medication List         allopurinol 300 MG tablet  Commonly known as:  ZYLOPRIM  Take 300 mg by mouth daily.     amLODipine 10 MG tablet  Commonly known as:  NORVASC  Take 10 mg by mouth every morning.     aspirin 325 MG EC tablet  Take 1 tablet (325 mg total) by mouth 2 (two) times daily.     DSS 100 MG Caps  Take 100 mg by mouth 2 (two) times daily.     ferrous sulfate 325 (65 FE) MG tablet  Take 1 tablet (325 mg total) by mouth 3 (three) times daily after meals.     HYDROcodone-acetaminophen 7.5-325 MG per tablet  Commonly known as:  NORCO  Take 1-2 tablets by mouth every 4 (four) hours as needed for moderate pain.     metoprolol succinate 100 MG 24 hr tablet  Commonly known as:  TOPROL-XL  Take 100 mg by mouth 2 (two) times daily. Take with or immediately following a meal.     polyethylene glycol packet  Commonly known as:  MIRALAX / GLYCOLAX  Take 17 g by mouth 2 (two) times daily.     pravastatin 40 MG tablet  Commonly known as:  PRAVACHOL  Take 40 mg by mouth every morning.     tamsulosin 0.4 MG Caps capsule  Commonly known as:  FLOMAX  Take 0.4 mg by mouth daily.     tiZANidine 4 MG tablet  Commonly known as:  ZANAFLEX  Take 1 tablet (4 mg total) by mouth every 6 (six) hours as needed for muscle spasms.          Signed: West Pugh. Khala Tarte   PAC  08/20/2013, 4:56 PM

## 2013-10-08 DIAGNOSIS — M169 Osteoarthritis of hip, unspecified: Secondary | ICD-10-CM | POA: Diagnosis not present

## 2013-10-08 DIAGNOSIS — M1610 Unilateral primary osteoarthritis, unspecified hip: Secondary | ICD-10-CM | POA: Diagnosis not present

## 2013-10-30 DIAGNOSIS — I10 Essential (primary) hypertension: Secondary | ICD-10-CM | POA: Diagnosis not present

## 2013-10-30 DIAGNOSIS — M7989 Other specified soft tissue disorders: Secondary | ICD-10-CM | POA: Diagnosis not present

## 2013-10-30 DIAGNOSIS — E785 Hyperlipidemia, unspecified: Secondary | ICD-10-CM | POA: Diagnosis not present

## 2013-10-30 DIAGNOSIS — D649 Anemia, unspecified: Secondary | ICD-10-CM | POA: Diagnosis not present

## 2013-10-30 DIAGNOSIS — M109 Gout, unspecified: Secondary | ICD-10-CM | POA: Diagnosis not present

## 2013-10-30 DIAGNOSIS — Z125 Encounter for screening for malignant neoplasm of prostate: Secondary | ICD-10-CM | POA: Diagnosis not present

## 2013-10-30 DIAGNOSIS — Z79899 Other long term (current) drug therapy: Secondary | ICD-10-CM | POA: Diagnosis not present

## 2013-11-01 DIAGNOSIS — R609 Edema, unspecified: Secondary | ICD-10-CM | POA: Diagnosis not present

## 2013-11-01 DIAGNOSIS — M79609 Pain in unspecified limb: Secondary | ICD-10-CM | POA: Diagnosis not present

## 2014-01-29 DIAGNOSIS — D649 Anemia, unspecified: Secondary | ICD-10-CM | POA: Diagnosis not present

## 2014-01-29 DIAGNOSIS — M109 Gout, unspecified: Secondary | ICD-10-CM | POA: Diagnosis not present

## 2014-01-29 DIAGNOSIS — E785 Hyperlipidemia, unspecified: Secondary | ICD-10-CM | POA: Diagnosis not present

## 2014-01-29 DIAGNOSIS — I10 Essential (primary) hypertension: Secondary | ICD-10-CM | POA: Diagnosis not present

## 2014-01-29 DIAGNOSIS — Z79899 Other long term (current) drug therapy: Secondary | ICD-10-CM | POA: Diagnosis not present

## 2014-05-09 DIAGNOSIS — H524 Presbyopia: Secondary | ICD-10-CM | POA: Diagnosis not present

## 2014-05-09 DIAGNOSIS — H5213 Myopia, bilateral: Secondary | ICD-10-CM | POA: Diagnosis not present

## 2014-05-09 DIAGNOSIS — H25813 Combined forms of age-related cataract, bilateral: Secondary | ICD-10-CM | POA: Diagnosis not present

## 2014-05-09 DIAGNOSIS — I10 Essential (primary) hypertension: Secondary | ICD-10-CM | POA: Diagnosis not present

## 2014-05-09 DIAGNOSIS — H43313 Vitreous membranes and strands, bilateral: Secondary | ICD-10-CM | POA: Diagnosis not present

## 2014-05-16 DIAGNOSIS — M109 Gout, unspecified: Secondary | ICD-10-CM | POA: Diagnosis not present

## 2014-05-16 DIAGNOSIS — I639 Cerebral infarction, unspecified: Secondary | ICD-10-CM | POA: Diagnosis not present

## 2014-05-16 DIAGNOSIS — I10 Essential (primary) hypertension: Secondary | ICD-10-CM | POA: Diagnosis not present

## 2014-05-16 DIAGNOSIS — E784 Other hyperlipidemia: Secondary | ICD-10-CM | POA: Diagnosis not present

## 2014-05-16 DIAGNOSIS — R0602 Shortness of breath: Secondary | ICD-10-CM | POA: Diagnosis not present

## 2014-08-19 DIAGNOSIS — I639 Cerebral infarction, unspecified: Secondary | ICD-10-CM | POA: Diagnosis not present

## 2014-08-19 DIAGNOSIS — D500 Iron deficiency anemia secondary to blood loss (chronic): Secondary | ICD-10-CM | POA: Diagnosis not present

## 2014-08-19 DIAGNOSIS — Z6836 Body mass index (BMI) 36.0-36.9, adult: Secondary | ICD-10-CM | POA: Diagnosis not present

## 2014-08-19 DIAGNOSIS — N529 Male erectile dysfunction, unspecified: Secondary | ICD-10-CM | POA: Diagnosis not present

## 2014-08-19 DIAGNOSIS — I10 Essential (primary) hypertension: Secondary | ICD-10-CM | POA: Diagnosis not present

## 2014-08-19 DIAGNOSIS — E784 Other hyperlipidemia: Secondary | ICD-10-CM | POA: Diagnosis not present

## 2014-08-19 DIAGNOSIS — N401 Enlarged prostate with lower urinary tract symptoms: Secondary | ICD-10-CM | POA: Diagnosis not present

## 2014-08-19 DIAGNOSIS — N63 Unspecified lump in breast: Secondary | ICD-10-CM | POA: Diagnosis not present

## 2014-08-21 DIAGNOSIS — R590 Localized enlarged lymph nodes: Secondary | ICD-10-CM | POA: Diagnosis not present

## 2014-08-21 DIAGNOSIS — N63 Unspecified lump in breast: Secondary | ICD-10-CM | POA: Diagnosis not present

## 2014-08-21 DIAGNOSIS — N62 Hypertrophy of breast: Secondary | ICD-10-CM | POA: Diagnosis not present

## 2014-08-27 DIAGNOSIS — K921 Melena: Secondary | ICD-10-CM | POA: Diagnosis not present

## 2014-08-27 DIAGNOSIS — K602 Anal fissure, unspecified: Secondary | ICD-10-CM | POA: Diagnosis not present

## 2014-08-27 DIAGNOSIS — Z6837 Body mass index (BMI) 37.0-37.9, adult: Secondary | ICD-10-CM | POA: Diagnosis not present

## 2014-08-27 DIAGNOSIS — E669 Obesity, unspecified: Secondary | ICD-10-CM | POA: Diagnosis not present

## 2014-09-19 DIAGNOSIS — K921 Melena: Secondary | ICD-10-CM | POA: Diagnosis not present

## 2014-09-19 DIAGNOSIS — Z6837 Body mass index (BMI) 37.0-37.9, adult: Secondary | ICD-10-CM | POA: Diagnosis not present

## 2014-09-19 DIAGNOSIS — E669 Obesity, unspecified: Secondary | ICD-10-CM | POA: Diagnosis not present

## 2014-09-19 DIAGNOSIS — K602 Anal fissure, unspecified: Secondary | ICD-10-CM | POA: Diagnosis not present

## 2014-09-19 DIAGNOSIS — Z79899 Other long term (current) drug therapy: Secondary | ICD-10-CM | POA: Diagnosis not present

## 2014-09-19 DIAGNOSIS — N400 Enlarged prostate without lower urinary tract symptoms: Secondary | ICD-10-CM | POA: Diagnosis not present

## 2014-09-19 DIAGNOSIS — Z933 Colostomy status: Secondary | ICD-10-CM | POA: Diagnosis not present

## 2014-09-19 DIAGNOSIS — K648 Other hemorrhoids: Secondary | ICD-10-CM | POA: Diagnosis not present

## 2014-09-19 DIAGNOSIS — I10 Essential (primary) hypertension: Secondary | ICD-10-CM | POA: Diagnosis not present

## 2014-11-18 DIAGNOSIS — K5900 Constipation, unspecified: Secondary | ICD-10-CM | POA: Diagnosis not present

## 2014-11-18 DIAGNOSIS — N401 Enlarged prostate with lower urinary tract symptoms: Secondary | ICD-10-CM | POA: Diagnosis not present

## 2014-11-18 DIAGNOSIS — N529 Male erectile dysfunction, unspecified: Secondary | ICD-10-CM | POA: Diagnosis not present

## 2014-11-18 DIAGNOSIS — H1010 Acute atopic conjunctivitis, unspecified eye: Secondary | ICD-10-CM | POA: Diagnosis not present

## 2014-11-18 DIAGNOSIS — I10 Essential (primary) hypertension: Secondary | ICD-10-CM | POA: Diagnosis not present

## 2014-11-18 DIAGNOSIS — E784 Other hyperlipidemia: Secondary | ICD-10-CM | POA: Diagnosis not present

## 2014-11-18 DIAGNOSIS — I639 Cerebral infarction, unspecified: Secondary | ICD-10-CM | POA: Diagnosis not present

## 2014-11-18 DIAGNOSIS — M109 Gout, unspecified: Secondary | ICD-10-CM | POA: Diagnosis not present

## 2014-11-18 DIAGNOSIS — D500 Iron deficiency anemia secondary to blood loss (chronic): Secondary | ICD-10-CM | POA: Diagnosis not present

## 2015-02-24 DIAGNOSIS — E784 Other hyperlipidemia: Secondary | ICD-10-CM | POA: Diagnosis not present

## 2015-02-24 DIAGNOSIS — N401 Enlarged prostate with lower urinary tract symptoms: Secondary | ICD-10-CM | POA: Diagnosis not present

## 2015-02-24 DIAGNOSIS — I639 Cerebral infarction, unspecified: Secondary | ICD-10-CM | POA: Diagnosis not present

## 2015-02-24 DIAGNOSIS — M109 Gout, unspecified: Secondary | ICD-10-CM | POA: Diagnosis not present

## 2015-02-24 DIAGNOSIS — H1010 Acute atopic conjunctivitis, unspecified eye: Secondary | ICD-10-CM | POA: Diagnosis not present

## 2015-03-25 DIAGNOSIS — H02104 Unspecified ectropion of left upper eyelid: Secondary | ICD-10-CM | POA: Diagnosis not present

## 2015-03-25 DIAGNOSIS — H1013 Acute atopic conjunctivitis, bilateral: Secondary | ICD-10-CM | POA: Diagnosis not present

## 2015-03-25 DIAGNOSIS — H04123 Dry eye syndrome of bilateral lacrimal glands: Secondary | ICD-10-CM | POA: Diagnosis not present

## 2015-03-25 DIAGNOSIS — D2311 Other benign neoplasm of skin of right eyelid, including canthus: Secondary | ICD-10-CM | POA: Diagnosis not present

## 2015-03-25 DIAGNOSIS — H02102 Unspecified ectropion of right lower eyelid: Secondary | ICD-10-CM | POA: Diagnosis not present

## 2015-04-15 DIAGNOSIS — H02102 Unspecified ectropion of right lower eyelid: Secondary | ICD-10-CM | POA: Diagnosis not present

## 2015-04-15 DIAGNOSIS — H1013 Acute atopic conjunctivitis, bilateral: Secondary | ICD-10-CM | POA: Diagnosis not present

## 2015-05-27 DIAGNOSIS — I639 Cerebral infarction, unspecified: Secondary | ICD-10-CM | POA: Diagnosis not present

## 2015-05-27 DIAGNOSIS — Z9181 History of falling: Secondary | ICD-10-CM | POA: Diagnosis not present

## 2015-05-27 DIAGNOSIS — E784 Other hyperlipidemia: Secondary | ICD-10-CM | POA: Diagnosis not present

## 2015-05-27 DIAGNOSIS — Z125 Encounter for screening for malignant neoplasm of prostate: Secondary | ICD-10-CM | POA: Diagnosis not present

## 2015-05-27 DIAGNOSIS — Z1389 Encounter for screening for other disorder: Secondary | ICD-10-CM | POA: Diagnosis not present

## 2015-05-27 DIAGNOSIS — N401 Enlarged prostate with lower urinary tract symptoms: Secondary | ICD-10-CM | POA: Diagnosis not present

## 2015-05-27 DIAGNOSIS — M109 Gout, unspecified: Secondary | ICD-10-CM | POA: Diagnosis not present

## 2015-08-27 DIAGNOSIS — N401 Enlarged prostate with lower urinary tract symptoms: Secondary | ICD-10-CM | POA: Diagnosis not present

## 2015-08-27 DIAGNOSIS — Z6836 Body mass index (BMI) 36.0-36.9, adult: Secondary | ICD-10-CM | POA: Diagnosis not present

## 2015-08-27 DIAGNOSIS — E784 Other hyperlipidemia: Secondary | ICD-10-CM | POA: Diagnosis not present

## 2015-08-27 DIAGNOSIS — I639 Cerebral infarction, unspecified: Secondary | ICD-10-CM | POA: Diagnosis not present

## 2015-08-27 DIAGNOSIS — M109 Gout, unspecified: Secondary | ICD-10-CM | POA: Diagnosis not present

## 2015-11-24 DIAGNOSIS — M109 Gout, unspecified: Secondary | ICD-10-CM | POA: Diagnosis not present

## 2015-11-24 DIAGNOSIS — I639 Cerebral infarction, unspecified: Secondary | ICD-10-CM | POA: Diagnosis not present

## 2015-11-24 DIAGNOSIS — I10 Essential (primary) hypertension: Secondary | ICD-10-CM | POA: Diagnosis not present

## 2015-11-24 DIAGNOSIS — N401 Enlarged prostate with lower urinary tract symptoms: Secondary | ICD-10-CM | POA: Diagnosis not present

## 2015-11-24 DIAGNOSIS — Z6836 Body mass index (BMI) 36.0-36.9, adult: Secondary | ICD-10-CM | POA: Diagnosis not present

## 2015-11-24 DIAGNOSIS — E669 Obesity, unspecified: Secondary | ICD-10-CM | POA: Diagnosis not present

## 2015-11-24 DIAGNOSIS — E784 Other hyperlipidemia: Secondary | ICD-10-CM | POA: Diagnosis not present

## 2016-02-04 DIAGNOSIS — H5340 Unspecified visual field defects: Secondary | ICD-10-CM | POA: Diagnosis not present

## 2016-02-17 DIAGNOSIS — H02831 Dermatochalasis of right upper eyelid: Secondary | ICD-10-CM | POA: Diagnosis not present

## 2016-02-17 DIAGNOSIS — H02834 Dermatochalasis of left upper eyelid: Secondary | ICD-10-CM | POA: Diagnosis not present

## 2016-02-19 DIAGNOSIS — H02109 Unspecified ectropion of unspecified eye, unspecified eyelid: Secondary | ICD-10-CM | POA: Diagnosis not present

## 2016-02-19 DIAGNOSIS — H02401 Unspecified ptosis of right eyelid: Secondary | ICD-10-CM | POA: Diagnosis not present

## 2016-02-27 DIAGNOSIS — H02401 Unspecified ptosis of right eyelid: Secondary | ICD-10-CM | POA: Diagnosis not present

## 2016-02-27 DIAGNOSIS — D485 Neoplasm of uncertain behavior of skin: Secondary | ICD-10-CM | POA: Diagnosis not present

## 2016-02-27 DIAGNOSIS — H02125 Mechanical ectropion of left lower eyelid: Secondary | ICD-10-CM | POA: Diagnosis not present

## 2016-02-27 DIAGNOSIS — D2311 Other benign neoplasm of skin of right eyelid, including canthus: Secondary | ICD-10-CM | POA: Diagnosis not present

## 2016-02-27 DIAGNOSIS — H02124 Mechanical ectropion of left upper eyelid: Secondary | ICD-10-CM | POA: Diagnosis not present

## 2016-02-27 DIAGNOSIS — H02109 Unspecified ectropion of unspecified eye, unspecified eyelid: Secondary | ICD-10-CM | POA: Diagnosis not present

## 2016-02-27 DIAGNOSIS — H02102 Unspecified ectropion of right lower eyelid: Secondary | ICD-10-CM | POA: Diagnosis not present

## 2016-03-01 DIAGNOSIS — N401 Enlarged prostate with lower urinary tract symptoms: Secondary | ICD-10-CM | POA: Diagnosis not present

## 2016-03-01 DIAGNOSIS — I639 Cerebral infarction, unspecified: Secondary | ICD-10-CM | POA: Diagnosis not present

## 2016-03-01 DIAGNOSIS — H00039 Abscess of eyelid unspecified eye, unspecified eyelid: Secondary | ICD-10-CM | POA: Diagnosis not present

## 2016-03-01 DIAGNOSIS — M109 Gout, unspecified: Secondary | ICD-10-CM | POA: Diagnosis not present

## 2016-03-01 DIAGNOSIS — E784 Other hyperlipidemia: Secondary | ICD-10-CM | POA: Diagnosis not present

## 2016-04-14 DIAGNOSIS — H02124 Mechanical ectropion of left upper eyelid: Secondary | ICD-10-CM | POA: Diagnosis not present

## 2016-04-14 DIAGNOSIS — H02121 Mechanical ectropion of right upper eyelid: Secondary | ICD-10-CM | POA: Diagnosis not present

## 2016-04-23 DIAGNOSIS — H02124 Mechanical ectropion of left upper eyelid: Secondary | ICD-10-CM | POA: Diagnosis not present

## 2016-04-23 DIAGNOSIS — H02402 Unspecified ptosis of left eyelid: Secondary | ICD-10-CM | POA: Diagnosis not present

## 2016-06-01 DIAGNOSIS — Z6837 Body mass index (BMI) 37.0-37.9, adult: Secondary | ICD-10-CM | POA: Diagnosis not present

## 2016-06-01 DIAGNOSIS — N401 Enlarged prostate with lower urinary tract symptoms: Secondary | ICD-10-CM | POA: Diagnosis not present

## 2016-06-01 DIAGNOSIS — I639 Cerebral infarction, unspecified: Secondary | ICD-10-CM | POA: Diagnosis not present

## 2016-06-01 DIAGNOSIS — Z125 Encounter for screening for malignant neoplasm of prostate: Secondary | ICD-10-CM | POA: Diagnosis not present

## 2016-06-01 DIAGNOSIS — R739 Hyperglycemia, unspecified: Secondary | ICD-10-CM | POA: Diagnosis not present

## 2016-06-01 DIAGNOSIS — Z9181 History of falling: Secondary | ICD-10-CM | POA: Diagnosis not present

## 2016-06-01 DIAGNOSIS — Z1389 Encounter for screening for other disorder: Secondary | ICD-10-CM | POA: Diagnosis not present

## 2016-06-01 DIAGNOSIS — E784 Other hyperlipidemia: Secondary | ICD-10-CM | POA: Diagnosis not present

## 2016-06-01 DIAGNOSIS — M109 Gout, unspecified: Secondary | ICD-10-CM | POA: Diagnosis not present

## 2016-06-02 DIAGNOSIS — R234 Changes in skin texture: Secondary | ICD-10-CM | POA: Diagnosis not present

## 2016-06-23 DIAGNOSIS — R972 Elevated prostate specific antigen [PSA]: Secondary | ICD-10-CM | POA: Diagnosis not present

## 2016-07-23 DIAGNOSIS — R972 Elevated prostate specific antigen [PSA]: Secondary | ICD-10-CM | POA: Diagnosis not present

## 2016-07-23 DIAGNOSIS — N401 Enlarged prostate with lower urinary tract symptoms: Secondary | ICD-10-CM | POA: Diagnosis not present

## 2016-11-30 DIAGNOSIS — N401 Enlarged prostate with lower urinary tract symptoms: Secondary | ICD-10-CM | POA: Diagnosis not present

## 2016-11-30 DIAGNOSIS — E784 Other hyperlipidemia: Secondary | ICD-10-CM | POA: Diagnosis not present

## 2016-11-30 DIAGNOSIS — I639 Cerebral infarction, unspecified: Secondary | ICD-10-CM | POA: Diagnosis not present

## 2016-11-30 DIAGNOSIS — M109 Gout, unspecified: Secondary | ICD-10-CM | POA: Diagnosis not present

## 2016-11-30 DIAGNOSIS — E669 Obesity, unspecified: Secondary | ICD-10-CM | POA: Diagnosis not present

## 2016-11-30 DIAGNOSIS — R972 Elevated prostate specific antigen [PSA]: Secondary | ICD-10-CM | POA: Diagnosis not present

## 2016-11-30 DIAGNOSIS — Z6836 Body mass index (BMI) 36.0-36.9, adult: Secondary | ICD-10-CM | POA: Diagnosis not present

## 2016-12-06 DIAGNOSIS — L821 Other seborrheic keratosis: Secondary | ICD-10-CM | POA: Diagnosis not present

## 2016-12-06 DIAGNOSIS — L570 Actinic keratosis: Secondary | ICD-10-CM | POA: Diagnosis not present

## 2016-12-06 DIAGNOSIS — D225 Melanocytic nevi of trunk: Secondary | ICD-10-CM | POA: Diagnosis not present

## 2016-12-06 DIAGNOSIS — L578 Other skin changes due to chronic exposure to nonionizing radiation: Secondary | ICD-10-CM | POA: Diagnosis not present

## 2017-06-02 DIAGNOSIS — Z79899 Other long term (current) drug therapy: Secondary | ICD-10-CM | POA: Diagnosis not present

## 2017-06-02 DIAGNOSIS — I10 Essential (primary) hypertension: Secondary | ICD-10-CM | POA: Diagnosis not present

## 2017-06-02 DIAGNOSIS — E785 Hyperlipidemia, unspecified: Secondary | ICD-10-CM | POA: Diagnosis not present

## 2017-06-02 DIAGNOSIS — R972 Elevated prostate specific antigen [PSA]: Secondary | ICD-10-CM | POA: Diagnosis not present

## 2017-06-02 DIAGNOSIS — M109 Gout, unspecified: Secondary | ICD-10-CM | POA: Diagnosis not present

## 2017-06-02 DIAGNOSIS — N401 Enlarged prostate with lower urinary tract symptoms: Secondary | ICD-10-CM | POA: Diagnosis not present

## 2017-06-02 DIAGNOSIS — I639 Cerebral infarction, unspecified: Secondary | ICD-10-CM | POA: Diagnosis not present

## 2017-06-02 DIAGNOSIS — Z6836 Body mass index (BMI) 36.0-36.9, adult: Secondary | ICD-10-CM | POA: Diagnosis not present

## 2017-06-02 DIAGNOSIS — E669 Obesity, unspecified: Secondary | ICD-10-CM | POA: Diagnosis not present

## 2017-06-02 DIAGNOSIS — Z1211 Encounter for screening for malignant neoplasm of colon: Secondary | ICD-10-CM | POA: Diagnosis not present

## 2017-06-02 DIAGNOSIS — D649 Anemia, unspecified: Secondary | ICD-10-CM | POA: Diagnosis not present

## 2017-06-23 DIAGNOSIS — D500 Iron deficiency anemia secondary to blood loss (chronic): Secondary | ICD-10-CM | POA: Diagnosis not present

## 2017-06-29 DIAGNOSIS — D500 Iron deficiency anemia secondary to blood loss (chronic): Secondary | ICD-10-CM | POA: Diagnosis not present

## 2017-06-29 DIAGNOSIS — Z6835 Body mass index (BMI) 35.0-35.9, adult: Secondary | ICD-10-CM | POA: Diagnosis not present

## 2017-06-29 DIAGNOSIS — R195 Other fecal abnormalities: Secondary | ICD-10-CM | POA: Diagnosis not present

## 2017-07-07 DIAGNOSIS — M109 Gout, unspecified: Secondary | ICD-10-CM | POA: Diagnosis not present

## 2017-07-07 DIAGNOSIS — C186 Malignant neoplasm of descending colon: Secondary | ICD-10-CM | POA: Diagnosis not present

## 2017-07-07 DIAGNOSIS — D374 Neoplasm of uncertain behavior of colon: Secondary | ICD-10-CM | POA: Diagnosis not present

## 2017-07-07 DIAGNOSIS — E785 Hyperlipidemia, unspecified: Secondary | ICD-10-CM | POA: Diagnosis not present

## 2017-07-07 DIAGNOSIS — Z8673 Personal history of transient ischemic attack (TIA), and cerebral infarction without residual deficits: Secondary | ICD-10-CM | POA: Diagnosis not present

## 2017-07-07 DIAGNOSIS — C189 Malignant neoplasm of colon, unspecified: Secondary | ICD-10-CM | POA: Diagnosis not present

## 2017-07-07 DIAGNOSIS — N401 Enlarged prostate with lower urinary tract symptoms: Secondary | ICD-10-CM | POA: Diagnosis not present

## 2017-07-07 DIAGNOSIS — R3914 Feeling of incomplete bladder emptying: Secondary | ICD-10-CM | POA: Diagnosis not present

## 2017-07-07 DIAGNOSIS — D649 Anemia, unspecified: Secondary | ICD-10-CM | POA: Diagnosis not present

## 2017-07-07 DIAGNOSIS — K648 Other hemorrhoids: Secondary | ICD-10-CM | POA: Diagnosis not present

## 2017-07-07 DIAGNOSIS — Z7982 Long term (current) use of aspirin: Secondary | ICD-10-CM | POA: Diagnosis not present

## 2017-07-07 DIAGNOSIS — Z980 Intestinal bypass and anastomosis status: Secondary | ICD-10-CM | POA: Diagnosis not present

## 2017-07-07 DIAGNOSIS — Z79899 Other long term (current) drug therapy: Secondary | ICD-10-CM | POA: Diagnosis not present

## 2017-07-07 DIAGNOSIS — R195 Other fecal abnormalities: Secondary | ICD-10-CM | POA: Diagnosis not present

## 2017-07-07 DIAGNOSIS — M1990 Unspecified osteoarthritis, unspecified site: Secondary | ICD-10-CM | POA: Diagnosis not present

## 2017-07-07 DIAGNOSIS — I10 Essential (primary) hypertension: Secondary | ICD-10-CM | POA: Diagnosis not present

## 2017-07-11 DIAGNOSIS — C189 Malignant neoplasm of colon, unspecified: Secondary | ICD-10-CM | POA: Diagnosis not present

## 2017-07-11 DIAGNOSIS — C183 Malignant neoplasm of hepatic flexure: Secondary | ICD-10-CM

## 2017-07-11 DIAGNOSIS — Z6834 Body mass index (BMI) 34.0-34.9, adult: Secondary | ICD-10-CM | POA: Diagnosis not present

## 2017-07-11 HISTORY — DX: Malignant neoplasm of hepatic flexure: C18.3

## 2017-07-14 DIAGNOSIS — R918 Other nonspecific abnormal finding of lung field: Secondary | ICD-10-CM | POA: Diagnosis not present

## 2017-07-14 DIAGNOSIS — C189 Malignant neoplasm of colon, unspecified: Secondary | ICD-10-CM | POA: Diagnosis not present

## 2017-07-27 DIAGNOSIS — D649 Anemia, unspecified: Secondary | ICD-10-CM | POA: Diagnosis not present

## 2017-07-27 DIAGNOSIS — C184 Malignant neoplasm of transverse colon: Secondary | ICD-10-CM | POA: Diagnosis not present

## 2017-07-27 DIAGNOSIS — R161 Splenomegaly, not elsewhere classified: Secondary | ICD-10-CM | POA: Diagnosis not present

## 2017-07-27 DIAGNOSIS — R978 Other abnormal tumor markers: Secondary | ICD-10-CM | POA: Diagnosis not present

## 2017-07-27 DIAGNOSIS — C189 Malignant neoplasm of colon, unspecified: Secondary | ICD-10-CM | POA: Diagnosis not present

## 2017-08-02 DIAGNOSIS — R978 Other abnormal tumor markers: Secondary | ICD-10-CM | POA: Diagnosis not present

## 2017-08-02 DIAGNOSIS — C184 Malignant neoplasm of transverse colon: Secondary | ICD-10-CM | POA: Diagnosis not present

## 2017-08-02 DIAGNOSIS — D61818 Other pancytopenia: Secondary | ICD-10-CM | POA: Diagnosis not present

## 2017-08-02 DIAGNOSIS — R161 Splenomegaly, not elsewhere classified: Secondary | ICD-10-CM | POA: Diagnosis not present

## 2017-08-02 DIAGNOSIS — Z23 Encounter for immunization: Secondary | ICD-10-CM | POA: Diagnosis not present

## 2017-08-15 DIAGNOSIS — D490 Neoplasm of unspecified behavior of digestive system: Secondary | ICD-10-CM | POA: Diagnosis not present

## 2017-08-15 DIAGNOSIS — Z6834 Body mass index (BMI) 34.0-34.9, adult: Secondary | ICD-10-CM | POA: Diagnosis not present

## 2017-08-15 DIAGNOSIS — C183 Malignant neoplasm of hepatic flexure: Secondary | ICD-10-CM | POA: Diagnosis not present

## 2017-08-16 DIAGNOSIS — C189 Malignant neoplasm of colon, unspecified: Secondary | ICD-10-CM | POA: Diagnosis not present

## 2017-08-16 DIAGNOSIS — M109 Gout, unspecified: Secondary | ICD-10-CM | POA: Diagnosis not present

## 2017-08-16 DIAGNOSIS — J189 Pneumonia, unspecified organism: Secondary | ICD-10-CM | POA: Diagnosis not present

## 2017-08-16 DIAGNOSIS — R161 Splenomegaly, not elsewhere classified: Secondary | ICD-10-CM | POA: Diagnosis not present

## 2017-08-16 DIAGNOSIS — I639 Cerebral infarction, unspecified: Secondary | ICD-10-CM | POA: Diagnosis not present

## 2017-08-16 DIAGNOSIS — N401 Enlarged prostate with lower urinary tract symptoms: Secondary | ICD-10-CM | POA: Diagnosis not present

## 2017-08-24 DIAGNOSIS — D490 Neoplasm of unspecified behavior of digestive system: Secondary | ICD-10-CM | POA: Diagnosis not present

## 2017-08-24 DIAGNOSIS — Z8673 Personal history of transient ischemic attack (TIA), and cerebral infarction without residual deficits: Secondary | ICD-10-CM | POA: Diagnosis not present

## 2017-08-24 DIAGNOSIS — K5900 Constipation, unspecified: Secondary | ICD-10-CM | POA: Diagnosis present

## 2017-08-24 DIAGNOSIS — C772 Secondary and unspecified malignant neoplasm of intra-abdominal lymph nodes: Secondary | ICD-10-CM | POA: Diagnosis not present

## 2017-08-24 DIAGNOSIS — M1990 Unspecified osteoarthritis, unspecified site: Secondary | ICD-10-CM | POA: Diagnosis present

## 2017-08-24 DIAGNOSIS — M109 Gout, unspecified: Secondary | ICD-10-CM | POA: Diagnosis present

## 2017-08-24 DIAGNOSIS — I10 Essential (primary) hypertension: Secondary | ICD-10-CM | POA: Diagnosis not present

## 2017-08-24 DIAGNOSIS — C183 Malignant neoplasm of hepatic flexure: Secondary | ICD-10-CM | POA: Diagnosis not present

## 2017-08-24 DIAGNOSIS — R05 Cough: Secondary | ICD-10-CM | POA: Diagnosis not present

## 2017-08-24 DIAGNOSIS — K660 Peritoneal adhesions (postprocedural) (postinfection): Secondary | ICD-10-CM | POA: Diagnosis present

## 2017-08-24 DIAGNOSIS — E785 Hyperlipidemia, unspecified: Secondary | ICD-10-CM | POA: Diagnosis present

## 2017-08-24 DIAGNOSIS — D500 Iron deficiency anemia secondary to blood loss (chronic): Secondary | ICD-10-CM | POA: Diagnosis present

## 2017-08-24 DIAGNOSIS — C189 Malignant neoplasm of colon, unspecified: Secondary | ICD-10-CM | POA: Diagnosis not present

## 2017-08-24 DIAGNOSIS — Z79899 Other long term (current) drug therapy: Secondary | ICD-10-CM | POA: Diagnosis not present

## 2017-08-24 DIAGNOSIS — N401 Enlarged prostate with lower urinary tract symptoms: Secondary | ICD-10-CM | POA: Diagnosis present

## 2017-08-24 DIAGNOSIS — R339 Retention of urine, unspecified: Secondary | ICD-10-CM | POA: Diagnosis present

## 2017-08-31 DIAGNOSIS — J181 Lobar pneumonia, unspecified organism: Secondary | ICD-10-CM | POA: Diagnosis not present

## 2017-08-31 DIAGNOSIS — D500 Iron deficiency anemia secondary to blood loss (chronic): Secondary | ICD-10-CM | POA: Diagnosis not present

## 2017-08-31 DIAGNOSIS — C189 Malignant neoplasm of colon, unspecified: Secondary | ICD-10-CM | POA: Diagnosis not present

## 2017-08-31 DIAGNOSIS — D739 Disease of spleen, unspecified: Secondary | ICD-10-CM | POA: Diagnosis not present

## 2017-08-31 DIAGNOSIS — R161 Splenomegaly, not elsewhere classified: Secondary | ICD-10-CM | POA: Diagnosis not present

## 2017-08-31 DIAGNOSIS — Z79899 Other long term (current) drug therapy: Secondary | ICD-10-CM | POA: Diagnosis not present

## 2017-08-31 DIAGNOSIS — I639 Cerebral infarction, unspecified: Secondary | ICD-10-CM | POA: Diagnosis not present

## 2017-08-31 DIAGNOSIS — J9801 Acute bronchospasm: Secondary | ICD-10-CM | POA: Diagnosis not present

## 2017-08-31 DIAGNOSIS — Z6832 Body mass index (BMI) 32.0-32.9, adult: Secondary | ICD-10-CM | POA: Diagnosis not present

## 2017-08-31 DIAGNOSIS — R079 Chest pain, unspecified: Secondary | ICD-10-CM | POA: Diagnosis not present

## 2017-09-01 DIAGNOSIS — R7989 Other specified abnormal findings of blood chemistry: Secondary | ICD-10-CM | POA: Diagnosis not present

## 2017-09-01 DIAGNOSIS — R079 Chest pain, unspecified: Secondary | ICD-10-CM | POA: Diagnosis not present

## 2017-09-02 DIAGNOSIS — D500 Iron deficiency anemia secondary to blood loss (chronic): Secondary | ICD-10-CM | POA: Diagnosis not present

## 2017-09-02 DIAGNOSIS — D739 Disease of spleen, unspecified: Secondary | ICD-10-CM | POA: Diagnosis not present

## 2017-09-02 DIAGNOSIS — D473 Essential (hemorrhagic) thrombocythemia: Secondary | ICD-10-CM | POA: Diagnosis not present

## 2017-09-02 DIAGNOSIS — I639 Cerebral infarction, unspecified: Secondary | ICD-10-CM | POA: Diagnosis not present

## 2017-09-02 DIAGNOSIS — Z6832 Body mass index (BMI) 32.0-32.9, adult: Secondary | ICD-10-CM | POA: Diagnosis not present

## 2017-09-02 DIAGNOSIS — C189 Malignant neoplasm of colon, unspecified: Secondary | ICD-10-CM | POA: Diagnosis not present

## 2017-09-06 DIAGNOSIS — R978 Other abnormal tumor markers: Secondary | ICD-10-CM | POA: Diagnosis not present

## 2017-09-06 DIAGNOSIS — C778 Secondary and unspecified malignant neoplasm of lymph nodes of multiple regions: Secondary | ICD-10-CM | POA: Diagnosis not present

## 2017-09-06 DIAGNOSIS — C779 Secondary and unspecified malignant neoplasm of lymph node, unspecified: Secondary | ICD-10-CM | POA: Diagnosis not present

## 2017-09-06 DIAGNOSIS — C184 Malignant neoplasm of transverse colon: Secondary | ICD-10-CM | POA: Diagnosis not present

## 2017-09-06 DIAGNOSIS — C189 Malignant neoplasm of colon, unspecified: Secondary | ICD-10-CM | POA: Diagnosis not present

## 2017-09-06 DIAGNOSIS — D509 Iron deficiency anemia, unspecified: Secondary | ICD-10-CM | POA: Diagnosis not present

## 2017-09-06 DIAGNOSIS — Z9081 Acquired absence of spleen: Secondary | ICD-10-CM | POA: Diagnosis not present

## 2017-09-20 DIAGNOSIS — C182 Malignant neoplasm of ascending colon: Secondary | ICD-10-CM | POA: Diagnosis not present

## 2017-09-28 DIAGNOSIS — C183 Malignant neoplasm of hepatic flexure: Secondary | ICD-10-CM | POA: Diagnosis not present

## 2017-09-28 DIAGNOSIS — Z79899 Other long term (current) drug therapy: Secondary | ICD-10-CM | POA: Diagnosis not present

## 2017-09-28 DIAGNOSIS — C184 Malignant neoplasm of transverse colon: Secondary | ICD-10-CM | POA: Diagnosis not present

## 2017-09-28 DIAGNOSIS — E785 Hyperlipidemia, unspecified: Secondary | ICD-10-CM | POA: Diagnosis not present

## 2017-09-28 DIAGNOSIS — M109 Gout, unspecified: Secondary | ICD-10-CM | POA: Diagnosis not present

## 2017-09-28 DIAGNOSIS — N401 Enlarged prostate with lower urinary tract symptoms: Secondary | ICD-10-CM | POA: Diagnosis not present

## 2017-09-28 DIAGNOSIS — R338 Other retention of urine: Secondary | ICD-10-CM | POA: Diagnosis not present

## 2017-09-28 DIAGNOSIS — Z8673 Personal history of transient ischemic attack (TIA), and cerebral infarction without residual deficits: Secondary | ICD-10-CM | POA: Diagnosis not present

## 2017-09-28 DIAGNOSIS — I10 Essential (primary) hypertension: Secondary | ICD-10-CM | POA: Diagnosis not present

## 2017-09-28 DIAGNOSIS — Z7982 Long term (current) use of aspirin: Secondary | ICD-10-CM | POA: Diagnosis not present

## 2017-09-28 DIAGNOSIS — J9811 Atelectasis: Secondary | ICD-10-CM | POA: Diagnosis not present

## 2017-10-06 DIAGNOSIS — C182 Malignant neoplasm of ascending colon: Secondary | ICD-10-CM | POA: Diagnosis not present

## 2017-10-06 DIAGNOSIS — Z23 Encounter for immunization: Secondary | ICD-10-CM | POA: Diagnosis not present

## 2017-10-12 DIAGNOSIS — C182 Malignant neoplasm of ascending colon: Secondary | ICD-10-CM | POA: Diagnosis not present

## 2017-10-20 DIAGNOSIS — R040 Epistaxis: Secondary | ICD-10-CM | POA: Diagnosis not present

## 2017-10-20 DIAGNOSIS — C182 Malignant neoplasm of ascending colon: Secondary | ICD-10-CM | POA: Diagnosis not present

## 2017-10-20 DIAGNOSIS — C184 Malignant neoplasm of transverse colon: Secondary | ICD-10-CM | POA: Diagnosis not present

## 2017-10-20 DIAGNOSIS — Z9049 Acquired absence of other specified parts of digestive tract: Secondary | ICD-10-CM | POA: Diagnosis not present

## 2017-11-10 DIAGNOSIS — Z9049 Acquired absence of other specified parts of digestive tract: Secondary | ICD-10-CM | POA: Diagnosis not present

## 2017-11-10 DIAGNOSIS — C184 Malignant neoplasm of transverse colon: Secondary | ICD-10-CM | POA: Diagnosis not present

## 2017-11-10 DIAGNOSIS — C182 Malignant neoplasm of ascending colon: Secondary | ICD-10-CM | POA: Diagnosis not present

## 2017-12-01 DIAGNOSIS — C182 Malignant neoplasm of ascending colon: Secondary | ICD-10-CM | POA: Diagnosis not present

## 2017-12-01 DIAGNOSIS — Z9049 Acquired absence of other specified parts of digestive tract: Secondary | ICD-10-CM | POA: Diagnosis not present

## 2017-12-01 DIAGNOSIS — C184 Malignant neoplasm of transverse colon: Secondary | ICD-10-CM | POA: Diagnosis not present

## 2017-12-15 DIAGNOSIS — E669 Obesity, unspecified: Secondary | ICD-10-CM | POA: Diagnosis not present

## 2017-12-15 DIAGNOSIS — C189 Malignant neoplasm of colon, unspecified: Secondary | ICD-10-CM | POA: Diagnosis not present

## 2017-12-15 DIAGNOSIS — E785 Hyperlipidemia, unspecified: Secondary | ICD-10-CM | POA: Diagnosis not present

## 2017-12-15 DIAGNOSIS — R972 Elevated prostate specific antigen [PSA]: Secondary | ICD-10-CM | POA: Diagnosis not present

## 2017-12-15 DIAGNOSIS — I77810 Thoracic aortic ectasia: Secondary | ICD-10-CM | POA: Diagnosis not present

## 2017-12-15 DIAGNOSIS — Z79899 Other long term (current) drug therapy: Secondary | ICD-10-CM | POA: Diagnosis not present

## 2017-12-15 DIAGNOSIS — R739 Hyperglycemia, unspecified: Secondary | ICD-10-CM | POA: Diagnosis not present

## 2017-12-15 DIAGNOSIS — M818 Other osteoporosis without current pathological fracture: Secondary | ICD-10-CM | POA: Diagnosis not present

## 2017-12-27 DIAGNOSIS — L821 Other seborrheic keratosis: Secondary | ICD-10-CM | POA: Diagnosis not present

## 2017-12-27 DIAGNOSIS — D2239 Melanocytic nevi of other parts of face: Secondary | ICD-10-CM | POA: Diagnosis not present

## 2017-12-27 DIAGNOSIS — C44529 Squamous cell carcinoma of skin of other part of trunk: Secondary | ICD-10-CM | POA: Diagnosis not present

## 2017-12-27 DIAGNOSIS — C44319 Basal cell carcinoma of skin of other parts of face: Secondary | ICD-10-CM | POA: Diagnosis not present

## 2018-01-11 DIAGNOSIS — K769 Liver disease, unspecified: Secondary | ICD-10-CM | POA: Diagnosis not present

## 2018-01-11 DIAGNOSIS — C182 Malignant neoplasm of ascending colon: Secondary | ICD-10-CM | POA: Diagnosis not present

## 2018-01-11 DIAGNOSIS — I700 Atherosclerosis of aorta: Secondary | ICD-10-CM | POA: Diagnosis not present

## 2018-01-12 DIAGNOSIS — C182 Malignant neoplasm of ascending colon: Secondary | ICD-10-CM | POA: Diagnosis not present

## 2018-01-12 DIAGNOSIS — Z9049 Acquired absence of other specified parts of digestive tract: Secondary | ICD-10-CM

## 2018-01-12 DIAGNOSIS — Z85038 Personal history of other malignant neoplasm of large intestine: Secondary | ICD-10-CM

## 2018-01-12 DIAGNOSIS — Z9221 Personal history of antineoplastic chemotherapy: Secondary | ICD-10-CM

## 2018-02-13 DIAGNOSIS — L570 Actinic keratosis: Secondary | ICD-10-CM | POA: Diagnosis not present

## 2018-02-13 DIAGNOSIS — D225 Melanocytic nevi of trunk: Secondary | ICD-10-CM | POA: Diagnosis not present

## 2018-02-14 DIAGNOSIS — N401 Enlarged prostate with lower urinary tract symptoms: Secondary | ICD-10-CM | POA: Diagnosis not present

## 2018-02-14 DIAGNOSIS — I77810 Thoracic aortic ectasia: Secondary | ICD-10-CM | POA: Diagnosis not present

## 2018-02-14 DIAGNOSIS — I10 Essential (primary) hypertension: Secondary | ICD-10-CM | POA: Diagnosis not present

## 2018-02-14 DIAGNOSIS — R972 Elevated prostate specific antigen [PSA]: Secondary | ICD-10-CM | POA: Diagnosis not present

## 2018-03-21 DIAGNOSIS — Z1331 Encounter for screening for depression: Secondary | ICD-10-CM | POA: Diagnosis not present

## 2018-03-21 DIAGNOSIS — Z9181 History of falling: Secondary | ICD-10-CM | POA: Diagnosis not present

## 2018-03-21 DIAGNOSIS — C189 Malignant neoplasm of colon, unspecified: Secondary | ICD-10-CM | POA: Diagnosis not present

## 2018-03-21 DIAGNOSIS — I10 Essential (primary) hypertension: Secondary | ICD-10-CM | POA: Diagnosis not present

## 2018-03-21 DIAGNOSIS — M109 Gout, unspecified: Secondary | ICD-10-CM | POA: Diagnosis not present

## 2018-03-21 DIAGNOSIS — E785 Hyperlipidemia, unspecified: Secondary | ICD-10-CM | POA: Diagnosis not present

## 2018-03-23 DIAGNOSIS — R972 Elevated prostate specific antigen [PSA]: Secondary | ICD-10-CM | POA: Diagnosis not present

## 2018-03-23 DIAGNOSIS — E785 Hyperlipidemia, unspecified: Secondary | ICD-10-CM | POA: Diagnosis not present

## 2018-04-10 DIAGNOSIS — R972 Elevated prostate specific antigen [PSA]: Secondary | ICD-10-CM | POA: Diagnosis not present

## 2018-04-10 DIAGNOSIS — N401 Enlarged prostate with lower urinary tract symptoms: Secondary | ICD-10-CM | POA: Diagnosis not present

## 2018-04-18 DIAGNOSIS — R972 Elevated prostate specific antigen [PSA]: Secondary | ICD-10-CM | POA: Diagnosis not present

## 2018-04-18 DIAGNOSIS — D075 Carcinoma in situ of prostate: Secondary | ICD-10-CM | POA: Diagnosis not present

## 2018-04-18 DIAGNOSIS — N401 Enlarged prostate with lower urinary tract symptoms: Secondary | ICD-10-CM | POA: Diagnosis not present

## 2018-04-26 DIAGNOSIS — N401 Enlarged prostate with lower urinary tract symptoms: Secondary | ICD-10-CM | POA: Diagnosis not present

## 2018-04-26 DIAGNOSIS — R972 Elevated prostate specific antigen [PSA]: Secondary | ICD-10-CM | POA: Diagnosis not present

## 2018-05-12 DIAGNOSIS — C182 Malignant neoplasm of ascending colon: Secondary | ICD-10-CM | POA: Diagnosis not present

## 2018-05-15 DIAGNOSIS — Z9049 Acquired absence of other specified parts of digestive tract: Secondary | ICD-10-CM

## 2018-05-15 DIAGNOSIS — Z85038 Personal history of other malignant neoplasm of large intestine: Secondary | ICD-10-CM

## 2018-05-15 DIAGNOSIS — Z9221 Personal history of antineoplastic chemotherapy: Secondary | ICD-10-CM

## 2018-05-29 DIAGNOSIS — Z6836 Body mass index (BMI) 36.0-36.9, adult: Secondary | ICD-10-CM | POA: Diagnosis not present

## 2018-05-29 DIAGNOSIS — C183 Malignant neoplasm of hepatic flexure: Secondary | ICD-10-CM | POA: Diagnosis not present

## 2018-07-27 DIAGNOSIS — M1990 Unspecified osteoarthritis, unspecified site: Secondary | ICD-10-CM | POA: Diagnosis not present

## 2018-07-27 DIAGNOSIS — Z980 Intestinal bypass and anastomosis status: Secondary | ICD-10-CM | POA: Diagnosis not present

## 2018-07-27 DIAGNOSIS — R338 Other retention of urine: Secondary | ICD-10-CM | POA: Diagnosis not present

## 2018-07-27 DIAGNOSIS — M109 Gout, unspecified: Secondary | ICD-10-CM | POA: Diagnosis not present

## 2018-07-27 DIAGNOSIS — Z1211 Encounter for screening for malignant neoplasm of colon: Secondary | ICD-10-CM | POA: Diagnosis not present

## 2018-07-27 DIAGNOSIS — Z85038 Personal history of other malignant neoplasm of large intestine: Secondary | ICD-10-CM | POA: Diagnosis not present

## 2018-07-27 DIAGNOSIS — N401 Enlarged prostate with lower urinary tract symptoms: Secondary | ICD-10-CM | POA: Diagnosis not present

## 2018-07-27 DIAGNOSIS — C183 Malignant neoplasm of hepatic flexure: Secondary | ICD-10-CM | POA: Diagnosis not present

## 2018-07-27 DIAGNOSIS — E785 Hyperlipidemia, unspecified: Secondary | ICD-10-CM | POA: Diagnosis not present

## 2018-07-27 DIAGNOSIS — Z7982 Long term (current) use of aspirin: Secondary | ICD-10-CM | POA: Diagnosis not present

## 2018-07-27 DIAGNOSIS — Z8673 Personal history of transient ischemic attack (TIA), and cerebral infarction without residual deficits: Secondary | ICD-10-CM | POA: Diagnosis not present

## 2018-07-27 DIAGNOSIS — Z79899 Other long term (current) drug therapy: Secondary | ICD-10-CM | POA: Diagnosis not present

## 2018-07-27 DIAGNOSIS — Z08 Encounter for follow-up examination after completed treatment for malignant neoplasm: Secondary | ICD-10-CM | POA: Diagnosis not present

## 2018-07-27 DIAGNOSIS — I10 Essential (primary) hypertension: Secondary | ICD-10-CM | POA: Diagnosis not present

## 2018-07-27 DIAGNOSIS — D509 Iron deficiency anemia, unspecified: Secondary | ICD-10-CM | POA: Diagnosis not present

## 2018-09-13 DIAGNOSIS — N401 Enlarged prostate with lower urinary tract symptoms: Secondary | ICD-10-CM | POA: Diagnosis not present

## 2018-09-13 DIAGNOSIS — R972 Elevated prostate specific antigen [PSA]: Secondary | ICD-10-CM | POA: Diagnosis not present

## 2018-09-13 DIAGNOSIS — N529 Male erectile dysfunction, unspecified: Secondary | ICD-10-CM | POA: Diagnosis not present

## 2018-09-14 DIAGNOSIS — C182 Malignant neoplasm of ascending colon: Secondary | ICD-10-CM | POA: Diagnosis not present

## 2018-09-14 DIAGNOSIS — R978 Other abnormal tumor markers: Secondary | ICD-10-CM | POA: Diagnosis not present

## 2018-09-15 DIAGNOSIS — Z85038 Personal history of other malignant neoplasm of large intestine: Secondary | ICD-10-CM | POA: Diagnosis not present

## 2019-01-15 DIAGNOSIS — K8020 Calculus of gallbladder without cholecystitis without obstruction: Secondary | ICD-10-CM | POA: Diagnosis not present

## 2019-01-15 DIAGNOSIS — K7689 Other specified diseases of liver: Secondary | ICD-10-CM | POA: Diagnosis not present

## 2019-01-15 DIAGNOSIS — C182 Malignant neoplasm of ascending colon: Secondary | ICD-10-CM | POA: Diagnosis not present

## 2019-01-15 DIAGNOSIS — N281 Cyst of kidney, acquired: Secondary | ICD-10-CM | POA: Diagnosis not present

## 2019-01-16 DIAGNOSIS — Z85038 Personal history of other malignant neoplasm of large intestine: Secondary | ICD-10-CM | POA: Diagnosis not present

## 2019-04-10 DIAGNOSIS — N401 Enlarged prostate with lower urinary tract symptoms: Secondary | ICD-10-CM | POA: Diagnosis not present

## 2019-04-10 DIAGNOSIS — R972 Elevated prostate specific antigen [PSA]: Secondary | ICD-10-CM | POA: Diagnosis not present

## 2019-04-10 DIAGNOSIS — N529 Male erectile dysfunction, unspecified: Secondary | ICD-10-CM | POA: Diagnosis not present

## 2019-04-17 ENCOUNTER — Other Ambulatory Visit: Payer: Self-pay | Admitting: Urology

## 2019-04-17 DIAGNOSIS — R972 Elevated prostate specific antigen [PSA]: Secondary | ICD-10-CM

## 2019-05-16 ENCOUNTER — Other Ambulatory Visit: Payer: Self-pay

## 2019-05-16 ENCOUNTER — Ambulatory Visit
Admission: RE | Admit: 2019-05-16 | Discharge: 2019-05-16 | Disposition: A | Discharge status: Home / Self Care | Payer: Medicare Other | Source: Ambulatory Visit | Attending: Urology | Admitting: Urology

## 2019-05-16 DIAGNOSIS — R972 Elevated prostate specific antigen [PSA]: Secondary | ICD-10-CM

## 2019-05-28 ENCOUNTER — Other Ambulatory Visit: Payer: Self-pay

## 2019-05-28 ENCOUNTER — Ambulatory Visit (HOSPITAL_COMMUNITY)
Admission: RE | Admit: 2019-05-28 | Discharge: 2019-05-28 | Disposition: A | Discharge status: Home / Self Care | Payer: Medicare Other | Source: Ambulatory Visit | Attending: Urology | Admitting: Urology

## 2019-05-28 DIAGNOSIS — R972 Elevated prostate specific antigen [PSA]: Secondary | ICD-10-CM | POA: Insufficient documentation

## 2019-05-28 MED ORDER — LIDOCAINE HCL URETHRAL/MUCOSAL 2 % EX GEL
CUTANEOUS | Status: AC
  Filled 2019-05-28: qty 30

## 2019-05-28 MED ORDER — GADOBUTROL 1 MMOL/ML IV SOLN
10.0000 mL | Freq: Once | INTRAVENOUS | Status: AC | PRN
  Administered 2019-05-28: 13:00:00 10 mL via INTRAVENOUS

## 2019-08-17 DIAGNOSIS — M109 Gout, unspecified: Secondary | ICD-10-CM | POA: Diagnosis not present

## 2019-08-17 DIAGNOSIS — M25561 Pain in right knee: Secondary | ICD-10-CM | POA: Diagnosis not present

## 2019-08-17 DIAGNOSIS — I10 Essential (primary) hypertension: Secondary | ICD-10-CM | POA: Diagnosis not present

## 2019-08-17 DIAGNOSIS — E785 Hyperlipidemia, unspecified: Secondary | ICD-10-CM | POA: Diagnosis not present

## 2019-08-21 DIAGNOSIS — E785 Hyperlipidemia, unspecified: Secondary | ICD-10-CM | POA: Diagnosis not present

## 2019-08-21 DIAGNOSIS — I10 Essential (primary) hypertension: Secondary | ICD-10-CM | POA: Diagnosis not present

## 2019-09-13 DIAGNOSIS — L820 Inflamed seborrheic keratosis: Secondary | ICD-10-CM | POA: Diagnosis not present

## 2019-09-13 DIAGNOSIS — D225 Melanocytic nevi of trunk: Secondary | ICD-10-CM | POA: Diagnosis not present

## 2019-09-13 DIAGNOSIS — D485 Neoplasm of uncertain behavior of skin: Secondary | ICD-10-CM | POA: Diagnosis not present

## 2019-09-13 DIAGNOSIS — L821 Other seborrheic keratosis: Secondary | ICD-10-CM | POA: Diagnosis not present

## 2019-09-25 DIAGNOSIS — Z85038 Personal history of other malignant neoplasm of large intestine: Secondary | ICD-10-CM | POA: Diagnosis not present

## 2019-09-25 DIAGNOSIS — C182 Malignant neoplasm of ascending colon: Secondary | ICD-10-CM | POA: Diagnosis not present

## 2019-10-03 DIAGNOSIS — H43813 Vitreous degeneration, bilateral: Secondary | ICD-10-CM | POA: Diagnosis not present

## 2019-10-03 DIAGNOSIS — I10 Essential (primary) hypertension: Secondary | ICD-10-CM | POA: Diagnosis not present

## 2019-10-03 DIAGNOSIS — H25813 Combined forms of age-related cataract, bilateral: Secondary | ICD-10-CM | POA: Diagnosis not present

## 2019-10-03 DIAGNOSIS — H524 Presbyopia: Secondary | ICD-10-CM | POA: Diagnosis not present

## 2019-10-03 DIAGNOSIS — H5212 Myopia, left eye: Secondary | ICD-10-CM | POA: Diagnosis not present

## 2019-10-09 DIAGNOSIS — N401 Enlarged prostate with lower urinary tract symptoms: Secondary | ICD-10-CM | POA: Diagnosis not present

## 2019-10-09 DIAGNOSIS — R972 Elevated prostate specific antigen [PSA]: Secondary | ICD-10-CM | POA: Diagnosis not present

## 2019-10-09 DIAGNOSIS — N529 Male erectile dysfunction, unspecified: Secondary | ICD-10-CM | POA: Diagnosis not present

## 2019-11-22 DIAGNOSIS — Z9181 History of falling: Secondary | ICD-10-CM | POA: Diagnosis not present

## 2019-11-22 DIAGNOSIS — Z1331 Encounter for screening for depression: Secondary | ICD-10-CM | POA: Diagnosis not present

## 2019-11-22 DIAGNOSIS — I10 Essential (primary) hypertension: Secondary | ICD-10-CM | POA: Diagnosis not present

## 2019-11-22 DIAGNOSIS — Z139 Encounter for screening, unspecified: Secondary | ICD-10-CM | POA: Diagnosis not present

## 2020-01-11 DIAGNOSIS — N401 Enlarged prostate with lower urinary tract symptoms: Secondary | ICD-10-CM | POA: Diagnosis not present

## 2020-01-11 DIAGNOSIS — N529 Male erectile dysfunction, unspecified: Secondary | ICD-10-CM | POA: Diagnosis not present

## 2020-01-15 DIAGNOSIS — L918 Other hypertrophic disorders of the skin: Secondary | ICD-10-CM | POA: Diagnosis not present

## 2020-03-27 DIAGNOSIS — Z85038 Personal history of other malignant neoplasm of large intestine: Secondary | ICD-10-CM | POA: Diagnosis not present

## 2020-03-27 DIAGNOSIS — C182 Malignant neoplasm of ascending colon: Secondary | ICD-10-CM | POA: Diagnosis not present

## 2020-06-26 DIAGNOSIS — R7989 Other specified abnormal findings of blood chemistry: Secondary | ICD-10-CM | POA: Diagnosis not present

## 2020-06-26 DIAGNOSIS — N1831 Chronic kidney disease, stage 3a: Secondary | ICD-10-CM | POA: Insufficient documentation

## 2020-06-26 DIAGNOSIS — R972 Elevated prostate specific antigen [PSA]: Secondary | ICD-10-CM | POA: Diagnosis not present

## 2020-06-26 DIAGNOSIS — I21A9 Other myocardial infarction type: Secondary | ICD-10-CM | POA: Diagnosis not present

## 2020-06-26 DIAGNOSIS — N521 Erectile dysfunction due to diseases classified elsewhere: Secondary | ICD-10-CM | POA: Diagnosis not present

## 2020-06-26 DIAGNOSIS — Z6839 Body mass index (BMI) 39.0-39.9, adult: Secondary | ICD-10-CM | POA: Diagnosis not present

## 2020-06-26 DIAGNOSIS — N528 Other male erectile dysfunction: Secondary | ICD-10-CM | POA: Diagnosis not present

## 2020-06-26 DIAGNOSIS — N4883 Acquired buried penis: Secondary | ICD-10-CM

## 2020-06-26 HISTORY — DX: Acquired buried penis: N48.83

## 2020-06-26 HISTORY — DX: Chronic kidney disease, stage 3a: N18.31

## 2020-08-07 DIAGNOSIS — N1831 Chronic kidney disease, stage 3a: Secondary | ICD-10-CM | POA: Diagnosis not present

## 2020-08-07 DIAGNOSIS — R972 Elevated prostate specific antigen [PSA]: Secondary | ICD-10-CM | POA: Diagnosis not present

## 2020-08-07 DIAGNOSIS — N521 Erectile dysfunction due to diseases classified elsewhere: Secondary | ICD-10-CM | POA: Diagnosis not present

## 2020-08-11 DIAGNOSIS — Z6841 Body Mass Index (BMI) 40.0 and over, adult: Secondary | ICD-10-CM | POA: Diagnosis not present

## 2020-08-11 DIAGNOSIS — I10 Essential (primary) hypertension: Secondary | ICD-10-CM | POA: Diagnosis not present

## 2020-08-11 DIAGNOSIS — Z9189 Other specified personal risk factors, not elsewhere classified: Secondary | ICD-10-CM | POA: Diagnosis not present

## 2020-08-11 DIAGNOSIS — G4719 Other hypersomnia: Secondary | ICD-10-CM | POA: Diagnosis not present

## 2020-08-11 DIAGNOSIS — R0683 Snoring: Secondary | ICD-10-CM | POA: Diagnosis not present

## 2020-08-11 DIAGNOSIS — G4730 Sleep apnea, unspecified: Secondary | ICD-10-CM | POA: Diagnosis not present

## 2020-09-24 ENCOUNTER — Other Ambulatory Visit: Payer: Self-pay | Admitting: Hematology and Oncology

## 2020-09-24 ENCOUNTER — Inpatient Hospital Stay: Payer: Medicare Other | Attending: Oncology

## 2020-09-24 ENCOUNTER — Other Ambulatory Visit: Payer: Self-pay | Admitting: Oncology

## 2020-09-24 DIAGNOSIS — Z96649 Presence of unspecified artificial hip joint: Secondary | ICD-10-CM | POA: Insufficient documentation

## 2020-09-24 DIAGNOSIS — C189 Malignant neoplasm of colon, unspecified: Secondary | ICD-10-CM

## 2020-09-24 DIAGNOSIS — E669 Obesity, unspecified: Secondary | ICD-10-CM | POA: Insufficient documentation

## 2020-09-24 DIAGNOSIS — Z20822 Contact with and (suspected) exposure to covid-19: Secondary | ICD-10-CM | POA: Diagnosis not present

## 2020-09-24 DIAGNOSIS — D62 Acute posthemorrhagic anemia: Secondary | ICD-10-CM | POA: Insufficient documentation

## 2020-09-24 NOTE — Progress Notes (Signed)
[facility]  [address] [place],  [place]  [address] [phone]  Clinic Day:  09/25/2020   HISTORY OF PRESENT ILLNESS:  The patient is a 75 y.o. male with stage IIIB (T3 N1b M0) colon cancer, status post a right hemicolectomy in February 2019.  This was followed by 4 cycles of adjuvant XELOX chemotherapy, which were completed in May 2019.  He comes in today for routine followup.  Since his last visit, the patient has been doing well.   He denies having abdominal pain, rectal bleeding, other GI symptoms which concern him for disease recurrence.  PHYSICAL EXAM:  Blood pressure (!) 142/91, pulse 85, temperature 98.3 F (36.8 C), resp. rate 18, height 5\' 2"  (1.575 m), weight 219 lb 3.2 oz (99.4 kg), SpO2 95 %. Wt Readings from Last 3 Encounters:  09/25/20 219 lb 3.2 oz (99.4 kg)  08/14/13 199 lb (90.3 kg)  08/08/13 199 lb (90.3 kg)   Body mass index is 40.09 kg/m. Performance status (ECOG): 0 - Asymptomatic Physical Exam Constitutional:      Appearance: Normal appearance. He is not ill-appearing.  HENT:     Mouth/Throat:     Mouth: Mucous membranes are moist.     Pharynx: Oropharynx is clear. No oropharyngeal exudate or posterior oropharyngeal erythema.  Cardiovascular:     Rate and Rhythm: Normal rate and regular rhythm.     Heart sounds: No murmur heard. No friction rub. No gallop.   Pulmonary:     Effort: Pulmonary effort is normal. No respiratory distress.     Breath sounds: Normal breath sounds. No wheezing, rhonchi or rales.  Chest:  Breasts:     Right: No axillary adenopathy or supraclavicular adenopathy.     Left: No axillary adenopathy or supraclavicular adenopathy.    Abdominal:     General: Bowel sounds are normal. There is no distension.     Palpations: Abdomen is soft. There is no mass.     Tenderness: There is no abdominal tenderness.  Musculoskeletal:        General: No swelling.     Right lower leg: No edema.     Left  lower leg: No edema.  Lymphadenopathy:     Cervical: No cervical adenopathy.     Upper Body:     Right upper body: No supraclavicular or axillary adenopathy.     Left upper body: No supraclavicular or axillary adenopathy.     Lower Body: No right inguinal adenopathy. No left inguinal adenopathy.  Skin:    General: Skin is warm.     Coloration: Skin is not jaundiced.     Findings: No lesion or rash.  Neurological:     General: No focal deficit present.     Mental Status: He is alert and oriented to person, place, and time. Mental status is at baseline.     Cranial Nerves: Cranial nerves are intact.  Psychiatric:        Mood and Affect: Mood normal.        Behavior: Behavior normal.        Thought Content: Thought content normal.    LABS:    Ref. Range 09/25/2020 15:10  CEA Latest Ref Range: 0.0 - 4.7 ng/mL 1.5    ASSESSMENT & PLAN:  Assessment/Plan:  A 75 y.o. male with stage IIIB (T3 N1b M0) colon cancer, status post a right hemicolectomy in February 2019, followed by 4 cycles of adjuvant XELOX chemotherapy, completed in May 2019.  Based upon his physical exam and CEA level, the patient remains disease-free.  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  Based upon this, he will chronically have elevated blood counts.  As he is doing well from both a hematologic and oncologic standpoint, I will see him back in 6 months for repeat clinical assessment.  The patient understands all the plans discussed today and is in agreement with them.    Daquana Paddock Macarthur Critchley, MD

## 2020-09-24 NOTE — Progress Notes (Unsigned)
  [facility]  [address] [place],  [place]  [address] [phone]  Clinic Day:  09/24/2020  Referring physician: No ref. provider found   HISTORY OF PRESENT ILLNESS:  The patient is a 75 y.o. male with ***    PHYSICAL EXAM:  There were no vitals taken for this visit. Wt Readings from Last 3 Encounters:  08/14/13 199 lb (90.3 kg)  08/08/13 199 lb (90.3 kg)   There is no height or weight on file to calculate BMI. Performance status (ECOG): {CHL ONC [id number] Physical Exam  LABS:   CBC Latest Ref Rng & Units 08/15/2013  WBC 4.0 - 10.5 K/uL 10.5  Hemoglobin 13.0 - 17.0 g/dL 10.1(L)  Hematocrit 39.0 - 52.0 % 31.4(L)  Platelets 150 - 400 K/uL 173   CMP Latest Ref Rng & Units 08/15/2013  Glucose 70 - 99 mg/dL 170(H)  BUN 6 - 23 mg/dL 13  Creatinine 0.50 - 1.35 mg/dL 1.01  Sodium 137 - 147 mEq/L 136(L)  Potassium 3.7 - 5.3 mEq/L 4.3  Chloride 96 - 112 mEq/L 102  CO2 19 - 32 mEq/L 23  Calcium 8.4 - 10.5 mg/dL 8.3(L)     No results found for: CEA1 / No results found for: CEA1 No results found for: PSA1 No results found for: XFG182 No results found for: CAN125  No results found for: TOTALPROTELP, ALBUMINELP, A1GS, A2GS, BETS, BETA2SER, GAMS, MSPIKE, SPEI No results found for: TIBC, FERRITIN, IRONPCTSAT No results found for: LDH  No results found for: AFPTUMOR, TOTALPROTELP, ALBUMINELP, A1GS, A2GS, BETS, BETA2SER, GAMS, MSPIKE, SPEI, LDH, CEA1, PSA1, IGASERUM, IGGSERUM, IGMSERUM, THGAB, THYROGLB  Recent Review Flowsheet Data   There is no flowsheet data to display.      STUDIES:  No results found.    ASSESSMENT & PLAN:   Assessment/Plan:  A 75 y.o. male with *** .The patient understands all the plans discussed today and is in agreement with them.      William Vassel Macarthur Critchley, MD

## 2020-09-25 ENCOUNTER — Other Ambulatory Visit: Payer: Self-pay | Admitting: Hematology and Oncology

## 2020-09-25 ENCOUNTER — Inpatient Hospital Stay: Payer: Medicare Other

## 2020-09-25 ENCOUNTER — Inpatient Hospital Stay (INDEPENDENT_AMBULATORY_CARE_PROVIDER_SITE_OTHER): Payer: Medicare Other | Admitting: Oncology

## 2020-09-25 ENCOUNTER — Telehealth: Payer: Self-pay | Admitting: Oncology

## 2020-09-25 ENCOUNTER — Other Ambulatory Visit: Payer: Self-pay

## 2020-09-25 ENCOUNTER — Other Ambulatory Visit: Payer: Self-pay | Admitting: Oncology

## 2020-09-25 DIAGNOSIS — C182 Malignant neoplasm of ascending colon: Secondary | ICD-10-CM

## 2020-09-25 DIAGNOSIS — Z96649 Presence of unspecified artificial hip joint: Secondary | ICD-10-CM | POA: Diagnosis not present

## 2020-09-25 DIAGNOSIS — D649 Anemia, unspecified: Secondary | ICD-10-CM | POA: Diagnosis not present

## 2020-09-25 DIAGNOSIS — E669 Obesity, unspecified: Secondary | ICD-10-CM | POA: Diagnosis not present

## 2020-09-25 DIAGNOSIS — C189 Malignant neoplasm of colon, unspecified: Secondary | ICD-10-CM | POA: Diagnosis not present

## 2020-09-25 DIAGNOSIS — D62 Acute posthemorrhagic anemia: Secondary | ICD-10-CM | POA: Diagnosis not present

## 2020-09-25 LAB — CBC AND DIFFERENTIAL
HCT: 49 (ref 41–53)
Hemoglobin: 15.7 (ref 13.5–17.5)
Neutrophils Absolute: 6.19
Platelets: 488 — AB (ref 150–399)
WBC: 14.4

## 2020-09-25 LAB — CBC
Absolute Lymphocytes: 5.9 — AB (ref 0.65–4.75)
MCV: 94 (ref 80–94)
RBC: 5.22 — AB (ref 3.87–5.11)

## 2020-09-25 NOTE — Telephone Encounter (Signed)
Per 3/10 LOS, patient scheduled for Sept Appt's.  Patient entered Appt's in his phone

## 2020-09-26 LAB — CEA: CEA: 1.5 ng/mL (ref 0.0–4.7)

## 2020-10-01 DIAGNOSIS — R972 Elevated prostate specific antigen [PSA]: Secondary | ICD-10-CM | POA: Diagnosis not present

## 2020-10-01 DIAGNOSIS — N411 Chronic prostatitis: Secondary | ICD-10-CM | POA: Diagnosis not present

## 2020-10-01 DIAGNOSIS — C189 Malignant neoplasm of colon, unspecified: Secondary | ICD-10-CM

## 2020-10-01 HISTORY — DX: Malignant neoplasm of colon, unspecified: C18.9

## 2020-12-10 DIAGNOSIS — R0681 Apnea, not elsewhere classified: Secondary | ICD-10-CM | POA: Diagnosis not present

## 2020-12-10 DIAGNOSIS — Z6839 Body mass index (BMI) 39.0-39.9, adult: Secondary | ICD-10-CM | POA: Diagnosis not present

## 2020-12-10 DIAGNOSIS — R29818 Other symptoms and signs involving the nervous system: Secondary | ICD-10-CM | POA: Diagnosis not present

## 2020-12-10 DIAGNOSIS — G478 Other sleep disorders: Secondary | ICD-10-CM | POA: Diagnosis not present

## 2020-12-10 DIAGNOSIS — G4700 Insomnia, unspecified: Secondary | ICD-10-CM | POA: Diagnosis not present

## 2020-12-10 DIAGNOSIS — R0683 Snoring: Secondary | ICD-10-CM | POA: Diagnosis not present

## 2020-12-27 DIAGNOSIS — G4733 Obstructive sleep apnea (adult) (pediatric): Secondary | ICD-10-CM | POA: Diagnosis not present

## 2021-01-23 DIAGNOSIS — Z85038 Personal history of other malignant neoplasm of large intestine: Secondary | ICD-10-CM | POA: Diagnosis not present

## 2021-01-23 DIAGNOSIS — Z8673 Personal history of transient ischemic attack (TIA), and cerebral infarction without residual deficits: Secondary | ICD-10-CM | POA: Diagnosis not present

## 2021-01-23 DIAGNOSIS — Z6841 Body Mass Index (BMI) 40.0 and over, adult: Secondary | ICD-10-CM | POA: Diagnosis not present

## 2021-01-23 DIAGNOSIS — Z139 Encounter for screening, unspecified: Secondary | ICD-10-CM | POA: Diagnosis not present

## 2021-01-23 DIAGNOSIS — R739 Hyperglycemia, unspecified: Secondary | ICD-10-CM | POA: Diagnosis not present

## 2021-01-23 DIAGNOSIS — Z9181 History of falling: Secondary | ICD-10-CM | POA: Diagnosis not present

## 2021-01-23 DIAGNOSIS — E785 Hyperlipidemia, unspecified: Secondary | ICD-10-CM | POA: Diagnosis not present

## 2021-01-23 DIAGNOSIS — Z1331 Encounter for screening for depression: Secondary | ICD-10-CM | POA: Diagnosis not present

## 2021-01-23 DIAGNOSIS — N401 Enlarged prostate with lower urinary tract symptoms: Secondary | ICD-10-CM | POA: Diagnosis not present

## 2021-01-23 DIAGNOSIS — M109 Gout, unspecified: Secondary | ICD-10-CM | POA: Diagnosis not present

## 2021-01-23 DIAGNOSIS — R972 Elevated prostate specific antigen [PSA]: Secondary | ICD-10-CM | POA: Diagnosis not present

## 2021-01-23 DIAGNOSIS — I10 Essential (primary) hypertension: Secondary | ICD-10-CM | POA: Diagnosis not present

## 2021-01-27 ENCOUNTER — Other Ambulatory Visit: Payer: Self-pay

## 2021-01-27 ENCOUNTER — Emergency Department (HOSPITAL_COMMUNITY): Payer: Medicare Other

## 2021-01-27 ENCOUNTER — Encounter (HOSPITAL_COMMUNITY): Payer: Self-pay | Admitting: *Deleted

## 2021-01-27 ENCOUNTER — Observation Stay (HOSPITAL_COMMUNITY)
Admission: EM | Admit: 2021-01-27 | Discharge: 2021-01-28 | Disposition: A | Discharge status: Home / Self Care | Payer: Medicare Other | Attending: Emergency Medicine | Admitting: Emergency Medicine

## 2021-01-27 DIAGNOSIS — I10 Essential (primary) hypertension: Secondary | ICD-10-CM | POA: Diagnosis not present

## 2021-01-27 DIAGNOSIS — G459 Transient cerebral ischemic attack, unspecified: Secondary | ICD-10-CM | POA: Diagnosis not present

## 2021-01-27 DIAGNOSIS — Z96641 Presence of right artificial hip joint: Secondary | ICD-10-CM | POA: Diagnosis not present

## 2021-01-27 DIAGNOSIS — Z79899 Other long term (current) drug therapy: Secondary | ICD-10-CM | POA: Diagnosis not present

## 2021-01-27 DIAGNOSIS — Z7982 Long term (current) use of aspirin: Secondary | ICD-10-CM | POA: Diagnosis not present

## 2021-01-27 DIAGNOSIS — N400 Enlarged prostate without lower urinary tract symptoms: Secondary | ICD-10-CM

## 2021-01-27 DIAGNOSIS — E785 Hyperlipidemia, unspecified: Secondary | ICD-10-CM

## 2021-01-27 DIAGNOSIS — R4701 Aphasia: Secondary | ICD-10-CM

## 2021-01-27 DIAGNOSIS — Z20822 Contact with and (suspected) exposure to covid-19: Secondary | ICD-10-CM | POA: Diagnosis not present

## 2021-01-27 DIAGNOSIS — Z85038 Personal history of other malignant neoplasm of large intestine: Secondary | ICD-10-CM | POA: Insufficient documentation

## 2021-01-27 DIAGNOSIS — R4781 Slurred speech: Secondary | ICD-10-CM | POA: Diagnosis not present

## 2021-01-27 DIAGNOSIS — Z8673 Personal history of transient ischemic attack (TIA), and cerebral infarction without residual deficits: Secondary | ICD-10-CM | POA: Insufficient documentation

## 2021-01-27 DIAGNOSIS — E119 Type 2 diabetes mellitus without complications: Secondary | ICD-10-CM | POA: Insufficient documentation

## 2021-01-27 HISTORY — DX: Transient cerebral ischemic attack, unspecified: G45.9

## 2021-01-27 HISTORY — DX: Benign prostatic hyperplasia without lower urinary tract symptoms: N40.0

## 2021-01-27 LAB — BASIC METABOLIC PANEL
Anion gap: 10 (ref 5–15)
BUN: 20 mg/dL (ref 8–23)
CO2: 23 mmol/L (ref 22–32)
Calcium: 9.7 mg/dL (ref 8.9–10.3)
Chloride: 102 mmol/L (ref 98–111)
Creatinine, Ser: 1.37 mg/dL — ABNORMAL HIGH (ref 0.61–1.24)
GFR, Estimated: 54 mL/min — ABNORMAL LOW (ref >60)
Glucose, Bld: 110 mg/dL — ABNORMAL HIGH (ref 70–99)
Potassium: 4.6 mmol/L (ref 3.5–5.1)
Sodium: 135 mmol/L (ref 135–145)

## 2021-01-27 LAB — CBC WITH DIFFERENTIAL/PLATELET
Abs Immature Granulocytes: 0.03 10*3/uL (ref 0.00–0.07)
Basophils Absolute: 0.1 10*3/uL (ref 0.0–0.1)
Basophils Relative: 1 %
Eosinophils Absolute: 0.6 10*3/uL — ABNORMAL HIGH (ref 0.0–0.5)
Eosinophils Relative: 5 %
HCT: 47 % (ref 39.0–52.0)
Hemoglobin: 15.5 g/dL (ref 13.0–17.0)
Immature Granulocytes: 0 %
Lymphocytes Relative: 44 %
Lymphs Abs: 5.3 10*3/uL — ABNORMAL HIGH (ref 0.7–4.0)
MCH: 31.9 pg (ref 26.0–34.0)
MCHC: 33 g/dL (ref 30.0–36.0)
MCV: 96.7 fL (ref 80.0–100.0)
Monocytes Absolute: 1.3 10*3/uL — ABNORMAL HIGH (ref 0.1–1.0)
Monocytes Relative: 10 %
Neutro Abs: 4.8 10*3/uL (ref 1.7–7.7)
Neutrophils Relative %: 40 %
Platelets: 505 10*3/uL — ABNORMAL HIGH (ref 150–400)
RBC: 4.86 MIL/uL (ref 4.22–5.81)
RDW: 17.2 % — ABNORMAL HIGH (ref 11.5–15.5)
WBC: 12 10*3/uL — ABNORMAL HIGH (ref 4.0–10.5)
nRBC: 0.2 % (ref 0.0–0.2)

## 2021-01-27 LAB — PHOSPHORUS: Phosphorus: 3.7 mg/dL (ref 2.5–4.6)

## 2021-01-27 LAB — PROTIME-INR
INR: 0.9 (ref 0.8–1.2)
Prothrombin Time: 11.9 seconds (ref 11.4–15.2)

## 2021-01-27 LAB — MAGNESIUM: Magnesium: 2 mg/dL (ref 1.7–2.4)

## 2021-01-27 LAB — LIPID PANEL
Cholesterol: 210 mg/dL — ABNORMAL HIGH (ref 0–200)
HDL: 35 mg/dL — ABNORMAL LOW (ref >40)
LDL Cholesterol: 141 mg/dL — ABNORMAL HIGH (ref 0–99)
Total CHOL/HDL Ratio: 6 RATIO
Triglycerides: 168 mg/dL — ABNORMAL HIGH (ref <150)
VLDL: 34 mg/dL (ref 0–40)

## 2021-01-27 LAB — HEMOGLOBIN A1C
Hgb A1c MFr Bld: 7.4 % — ABNORMAL HIGH (ref 4.8–5.6)
Mean Plasma Glucose: 165.68 mg/dL

## 2021-01-27 LAB — SODIUM, URINE, RANDOM: Sodium, Ur: 86 mmol/L

## 2021-01-27 LAB — CREATININE, URINE, RANDOM: Creatinine, Urine: 64.12 mg/dL

## 2021-01-27 MED ORDER — ACETAMINOPHEN 325 MG PO TABS: 650.0000 mg | ORAL_TABLET | Freq: Four times a day (QID) | ORAL | Status: DC | PRN

## 2021-01-27 MED ORDER — POLYETHYLENE GLYCOL 3350 17 G PO PACK: 17.0000 g | PACK | Freq: Every day | ORAL | Status: DC | PRN

## 2021-01-27 MED ORDER — ENOXAPARIN SODIUM 60 MG/0.6ML IJ SOSY
50.0000 mg | PREFILLED_SYRINGE | INTRAMUSCULAR | Status: DC
  Filled 2021-01-27: qty 0.5

## 2021-01-27 MED ORDER — TAMSULOSIN HCL 0.4 MG PO CAPS
0.4000 mg | ORAL_CAPSULE | Freq: Every day | ORAL | Status: DC
  Administered 2021-01-27: 0.4 mg via ORAL
  Filled 2021-01-27 (×2): qty 1

## 2021-01-27 MED ORDER — ALLOPURINOL 300 MG PO TABS
300.0000 mg | ORAL_TABLET | Freq: Every day | ORAL | Status: DC
  Administered 2021-01-28: 300 mg via ORAL
  Filled 2021-01-27: qty 1

## 2021-01-27 MED ORDER — ACETAMINOPHEN 650 MG RE SUPP: 650.0000 mg | Freq: Four times a day (QID) | RECTAL | Status: DC | PRN

## 2021-01-27 MED ORDER — GADOBUTROL 1 MMOL/ML IV SOLN
9.0000 mL | Freq: Once | INTRAVENOUS | Status: AC | PRN
  Administered 2021-01-27: 9 mL via INTRAVENOUS

## 2021-01-27 NOTE — Consult Note (Addendum)
NEURO HOSPITALIST CONSULT NOTE   Requesting physician: Dr. Vanita Panda  Reason for Consult: TIA  History obtained from:  Patient  HPI:                                                                                                                                          William [name] is a 75 y.o. male with a past medical history of stroke (2013, on ASA 81mg  daily), Stage IIIB colon cancer s/p right hemicolectomy and chemotherapy (currently in remission), hypertension, hypercholesterolemia, stage 3 CKD, gout who presents to [facility] following a 30 minute episode of expressive aphasia around 7:00 this morning. William [name] describes knowing what he wanted to say and understanding those around him but was unable to verbally respond. When he was able to speak during this time, his wife says his words were slurred and only occasionally fluent.  William [name] is a never smoker, does not have a history of atrial fibrillation, CAD, clotting disorder, seizure or migraine. He and his wife suspect that he has sleep apnea (apneic for 30-45 seconds/episode) and is scheduled for a sleep study tomorrow night.  William [name] states that he usually takes his medications as prescribed, although occasionally misses them (I.e. didn't take flomax or his HLD medication last night).   At PA visit, William [name] was back to baseline with the exception of mild word finding difficulties. His wife believes he still occasionally sounds slurred but otherwise is normal.  Wife present at bedside  Pertinent Imaging/Diagnostics CT head 12 Jul: No acute intracranial abnormality, old left caudate  MRI Brain, MRA head/neck pending  Pertinent Medications ASA 81 daily Amlodipine 10mg  daily Metoprolol 100mg  daily Zanaflex 4mg  PRN spasms Norco 7.5 PRN pain  Past Medical History:  Diagnosis Date   Arthritis    BPH (benign prostatic hyperplasia)    Constipation    GERD (gastroesophageal reflux disease)     History of gout    Hypertension    Low testosterone    Shortness of breath    WITH EXERTION   Stroke ([place]) 07/11/12   RESIDUAL - SLIGHT NUMBNESS L HAND L SIDE OF FACE    Past Surgical History:  Procedure Laterality Date   COLOSTOMY  2011   for diverticulitis   COLOSTOMY REVERSAL  2012   inghernia     INGUINAL HERNIA REPAIR     TOTAL HIP ARTHROPLASTY Right 08/14/2013   Procedure: RIGHT TOTAL HIP ARTHROPLASTY ANTERIOR APPROACH;  Surgeon: Mauri Pole, MD;  Location: [facility];  Service: Orthopedics;  Laterality: Right;    Family History  Problem Relation Age of Onset   Lymphoma Mother    Diverticulitis Mother    Heart disease Father    Stroke Father    Diverticulitis Brother  Social History:  reports that he has never smoked. He has never used smokeless tobacco. He reports that he does not drink alcohol and does not use drugs.  No Known Allergies  MEDICATIONS:                                                                                                                   Current Meds  Medication Sig   allopurinol (ZYLOPRIM) 300 MG tablet Take 300 mg by mouth daily.     Review Of Systems negative except for that which is mentioned in the HPI  Blood pressure (!) 145/82, pulse (!) 59, temperature 97.8 F (36.6 C), temperature source Oral, resp. rate 20, SpO2 95 %.   Physical Examination:                                                                                                      General: WDWN male. Appears calm and comfortable in bed HEENT:  Normocephalic, no lesions, without obvious abnormality.  Normal external eye and conjunctiva.  Normal external ears and nose. Cardiovascular: RRR, on tele Pulmonary: Breathing comfortably on room air Abdomen: Soft Musculoskeletal: Tone and bulk normal throughout; no atrophy noted Skin: Visible skin warm and dry, no hyperpigmentation, vitiligo, or suspicious lesions  Neurological Examination:                                                                                                Mental Status: Alert, oriented x 4, thought content appropriate.  Speech fluent without evidence of aphasia. Able to follow 3-step commands without difficulty. Cranial Nerves: II: Visual fields grossly normal, pupils are equal, round, reactive to light. III,IV, VI: Ptosis not present, extra-ocular muscle movements intact bilaterally V,VII: Smile and eyebrow raise is symmetric. Facial light touch symmetric VIII: Hearing grossly intact IX,X: Uvula and palate rise symmetrically XI: SCM and bilateral shoulder shrug strength 5/5 XII: Midline tongue extension Motor: Right :     Upper extremity   5/5   Left:     Upper extremity   5/5          Lower extremity   5/5     Lower extremity   5/5 Pronator drift not  present Sensory: Light touch symmetric throughout Deep Tendon Reflexes: 2+ and symmetric throughout Plantars: Right: downgoing   Left: downgoing Cerebellar: Finger-to-nose test without evidence of dysmetria or ataxia. Heel-to-shin test executed within normal limits. Gait: Deferred   Lab Results: Basic Metabolic Panel: Recent Labs  Lab 01/27/21 0834  NA 135  K 4.6  CL 102  CO2 23  GLUCOSE 110*  BUN 20  CREATININE 1.37*  CALCIUM 9.7    Liver Function Tests: No results for input(s): AST, ALT, ALKPHOS, BILITOT, PROT, ALBUMIN in the last 168 hours. No results for input(s): LIPASE, AMYLASE in the last 168 hours. No results for input(s): AMMONIA in the last 168 hours.  CBC: Recent Labs  Lab 01/27/21 0834  WBC 12.0*  NEUTROABS 4.8  HGB 15.5  HCT 47.0  MCV 96.7  PLT 505*    Cardiac Enzymes: No results for input(s): CKTOTAL, CKMB, CKMBINDEX, TROPONINI in the last 168 hours.  Lipid Panel: No results for input(s): CHOL, TRIG, HDL, CHOLHDL, VLDL, LDLCALC in the last 168 hours.  CBG: No results for input(s): GLUCAP in the last 168 hours.  Microbiology: Results for orders placed or performed during the  hospital encounter of 08/08/13  Surgical pcr screen     Status: None   Collection Time: 08/08/13 10:53 AM   Specimen: Nasal Mucosa; Nasal Swab  Result Value Ref Range Status   MRSA, PCR NEGATIVE NEGATIVE Final   Staphylococcus aureus NEGATIVE NEGATIVE Final    Comment:        The Xpert SA Assay (FDA approved for NASAL specimens in patients over 45 years of age), is one component of a comprehensive surveillance program.  Test performance has been validated by [employer] for patients greater than or equal to 10 year old. It is not intended to diagnose infection nor to guide or monitor treatment.    Coagulation Studies: Recent Labs    01/27/21 0834  LABPROT 11.9  INR 0.9    Imaging: CT Head Wo Contrast  Result Date: 01/27/2021 CLINICAL DATA:  Slurred speech EXAM: CT HEAD WITHOUT CONTRAST TECHNIQUE: Contiguous axial images were obtained from the base of the skull through the vertex without intravenous contrast. COMPARISON:  07/12/2012 FINDINGS: Brain: Chronic microvascular changes in the deep white matter. Mild age related volume loss. 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 No acute intracranial abnormality. Specifically, no hemorrhage, hydrocephalus, mass lesion, acute infarction, or significant intracranial injury. Vascular: No hyperdense vessel or unexpected calcification. Skull: No acute calvarial abnormality. Sinuses/Orbits: No acute findings Other: None IMPRESSION: No acute intracranial abnormality. Electronically Signed   By: Rolm Baptise M.D.   On: 01/27/2021 09:38     Assessment: 75 year old male with a past medical history of stroke (2013, on ASA 81mg  daily), Stage IIIB colon cancer s/p right hemicolectomy and chemotherapy (currently in remission), hypertension, hypercholesterolemia, stage 3 CKD, gout who presents to [facility] following a 30 minute episode of expressive aphasia around 7:00 this morning. The symptoms have since resolved except for some occasional word finding  difficulties and slurred speech according to his wife. 1. Exam is nonfocal 2. Risk factors for stroke: Advanced age, prior stroke, HLD, HTN, chronic microvascular disease, potential sleep apnea.  3. EKG: Normal 4. His presentation is most consistent with a TIA. Recommend to move forward with a stroke/TIA work-up as below with modifications per the stroke team.  Recommendations: 1. HgbA1c - goal <7.0, fasting lipid panel - goal <70 2. MRI of the brain without contrast - pending 3. PT  consult, OT consult, Speech consult 4. Echocardiogram 5. 80 mg of Atorvastatin if LDL >70 6. Prophylactic therapy-Antiplatelet med: ASA 81mg  and plavix 75mg  x 7. Aggressive risk factor modification 8. Telemetry monitoring 9. Frequent neuro checks 10 NPO until passes stroke swallow screen 11. MRA of head and neck     Solon Augusta PA-C [facility]  I have seen and examined the patient. I have reviewed the assessment and recommendations and have made amendations as needed. 75 year old male with a past medical history of stroke (2013, on ASA 81mg  daily), Stage IIIB colon cancer s/p right hemicolectomy and chemotherapy (currently in remission), hypertension, hypercholesterolemia, stage 3 CKD, gout who presents to [facility] following a 30 minute episode of expressive aphasia around 7:00 this morning. The symptoms have since resolved except for some occasional word finding difficulties and slurred speech according to his wife. Exam is nonfocal. Overall presentation is most consistent with TIA. Recommendations as above.  Electronically signed: Dr. Kerney Elbe 01/27/2021, 2:28 PM

## 2021-01-27 NOTE — ED Triage Notes (Signed)
To ED for eval of slurred speech when he work up this am, lasting 30-45 min. States he had same 9 yrs ago and was admitted to [facility] at that time. Pt is not under the care of a neurologist. Speech is clear and appropriate at this time. Alert and oriented. Denies n/d/headache. Ambulatory without difficulty. States sometimes he feels like he has to 'really think of my words to get them out'. No facial droop noted. Arm strength equal and strong. Leg strength equal and strong.

## 2021-01-27 NOTE — H&P (Signed)
Date: 01/27/2021               Patient Name:  William [name] MRN: [id number]  DOB: [birth year] Age / Sex: 75 y.o., male   PCP: Penelope Coop, FNP         Medical Service: Internal Medicine Teaching Service         Attending Physician: Dr. Velna Ochs, MD    First Contact: Mitzie Na, MD Pager: MB [phone]  Second Contact: Tamsen Snider, MD Pager: Cherly Hensen [phone]       After Hours (After 5p/  First Contact Pager: [phone]  weekends / holidays): Second Contact Pager: [phone]   SUBJECTIVE  Chief Complaint: Word finding difficulties  History of Present Illness: William [name] is a 75 y.o. male with a pertinent PMH of hypertension, hyperlipidemia, gout, CVA (residual left hand numbness), colon cancer (2019, s/p surgery and chemo) who presents to [facility] with word finding difficulties.  Patient states that he was in his normal state of health until around 7 AM this morning when he noticed difficulty with speech.  He states that he could talk but would have a difficult time finding words.  He denies any other significant complaints.  He states that he did have a previous stroke 9 years ago with residual left hand numbness.  He also has a history of hypertension on antihypertensive medications and HLD.  He denies any history of smoking or alcohol use.  He states that his dad had a history of cardiovascular disease including CAD with CABG and at least 1 stroke.  Does have a primary care provider in [facility].  In the ED the patient had complete resolution of his word finding aphasia and was back to his baseline.  Medications: No current facility-administered medications on file prior to encounter.   Current Outpatient Medications on File Prior to Encounter  Medication Sig Dispense Refill   allopurinol (ZYLOPRIM) 300 MG tablet Take 300 mg by mouth daily.     amLODipine (NORVASC) 10 MG tablet Take 10 mg by mouth every morning.     aspirin 325 MG EC tablet Take  325 mg by mouth daily.     Cholecalciferol 125 MCG (5000 UT) TABS Take 1 tablet by mouth daily.     docusate sodium 100 MG CAPS Take 100 mg by mouth 2 (two) times daily. 10 capsule 0   finasteride (PROSCAR) 5 MG tablet Take 5 mg by mouth daily.     losartan (COZAAR) 100 MG tablet Take 100 mg by mouth daily.     magnesium oxide (MAG-OX) 400 MG tablet Take 400 mg by mouth daily.     pravastatin (PRAVACHOL) 40 MG tablet Take 40 mg by mouth every morning.     tamsulosin (FLOMAX) 0.4 MG CAPS capsule Take 0.4 mg by mouth daily.     Vitamin D, Ergocalciferol, (DRISDOL) 1.25 MG (50000 UNIT) CAPS capsule Take 50,000 Units by mouth once a week.     zinc gluconate 50 MG tablet Take 50 mg by mouth daily.     ferrous sulfate 325 (65 FE) MG tablet Take 1 tablet (325 mg total) by mouth 3 (three) times daily after meals. (Patient not taking: No sig reported)  3   HYDROcodone-acetaminophen (NORCO) 7.5-325 MG per tablet Take 1-2 tablets by mouth every 4 (four) hours as needed for moderate pain. (Patient not taking: Reported on 01/27/2021) 100 tablet 0   polyethylene glycol (MIRALAX / GLYCOLAX) packet Take 17 g by mouth 2 (  two) times daily. (Patient not taking: Reported on 01/27/2021) 14 each 0   tiZANidine (ZANAFLEX) 4 MG tablet Take 1 tablet (4 mg total) by mouth every 6 (six) hours as needed for muscle spasms. (Patient not taking: No sig reported) 30 tablet 0    Past Medical History:  Past Medical History:  Diagnosis Date   Arthritis    BPH (benign prostatic hyperplasia)    Constipation    GERD (gastroesophageal reflux disease)    History of gout    Hypertension    Low testosterone    Shortness of breath    WITH EXERTION   Stroke ([place]) 07/11/12   RESIDUAL - SLIGHT NUMBNESS L HAND L SIDE OF FACE    Social:  Lives -[place] Occupation -retired Research officer, trade [employer] -wife Level of function -independent PCP -has PCP in [place] Substance use -none  Family History: Family History  Problem Relation Age of Onset    Lymphoma Mother    Diverticulitis Mother    Heart disease Father    Stroke Father    Diverticulitis Brother     Allergies: Allergies as of 01/27/2021   (No Known Allergies)    Review of Systems: A complete ROS was negative except as per HPI.   OBJECTIVE:  Physical Exam: Blood pressure 137/79, pulse 68, temperature 97.8 F (36.6 C), temperature source Oral, resp. rate 20, height 5\' 2"  (1.575 m), weight 101 kg, SpO2 94 %. Physical Exam Constitutional:      Appearance: Normal appearance. He is obese.  HENT:     Head: Normocephalic and atraumatic.     Mouth/Throat:     Mouth: Mucous membranes are moist.     Pharynx: Oropharynx is clear.  Eyes:     Extraocular Movements: Extraocular movements intact.     Pupils: Pupils are equal, round, and reactive to light.  Cardiovascular:     Rate and Rhythm: Normal rate and regular rhythm.     Pulses: Normal pulses.     Heart sounds: Normal heart sounds.  Pulmonary:     Effort: Pulmonary effort is normal.     Breath sounds: Normal breath sounds.  Abdominal:     General: Bowel sounds are normal.     Palpations: Abdomen is soft.  Musculoskeletal:        General: No swelling. Normal range of motion.     Cervical back: Normal range of motion.  Skin:    General: Skin is warm and dry.  Neurological:     General: No focal deficit present.     Mental Status: He is alert and oriented to person, place, and time.    Pertinent Labs: CBC    Component Value Date/Time   WBC 12.0 (H) 01/27/2021 0834   RBC 4.86 01/27/2021 0834   HGB 15.5 01/27/2021 0834   HCT 47.0 01/27/2021 0834   PLT 505 (H) 01/27/2021 0834   MCV 96.7 01/27/2021 0834   MCV 94 09/25/2020 0000   MCH 31.9 01/27/2021 0834   MCHC 33.0 01/27/2021 0834   RDW 17.2 (H) 01/27/2021 0834   LYMPHSABS 5.3 (H) 01/27/2021 0834   MONOABS 1.3 (H) 01/27/2021 0834   EOSABS 0.6 (H) 01/27/2021 0834   BASOSABS 0.1 01/27/2021 0834     CMP     Component Value Date/Time   NA 135  01/27/2021 0834   K 4.6 01/27/2021 0834   CL 102 01/27/2021 0834   CO2 23 01/27/2021 0834   GLUCOSE 110 (H) 01/27/2021 0834   BUN 20 01/27/2021 0834  CREATININE 1.37 (H) 01/27/2021 0834   CALCIUM 9.7 01/27/2021 0834   GFRNONAA 54 (L) 01/27/2021 0834   GFRAA 87 (L) 08/15/2013 0353    Pertinent Imaging: CT Head Wo Contrast  Result Date: 01/27/2021 CLINICAL DATA:  Slurred speech EXAM: CT HEAD WITHOUT CONTRAST TECHNIQUE: Contiguous axial images were obtained from the base of the skull through the vertex without intravenous contrast. COMPARISON:  07/12/2012 FINDINGS: Brain: Chronic microvascular changes in the deep white matter. Mild age related volume loss. 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 No acute intracranial abnormality. Specifically, no hemorrhage, hydrocephalus, mass lesion, acute infarction, or significant intracranial injury. Vascular: No hyperdense vessel or unexpected calcification. Skull: No acute calvarial abnormality. Sinuses/Orbits: No acute findings Other: None IMPRESSION: No acute intracranial abnormality. Electronically Signed   By: Rolm Baptise M.D.   On: 01/27/2021 09:38   MR ANGIO HEAD WO CONTRAST  Result Date: 01/27/2021 CLINICAL DATA:  TIA EXAM: MRI HEAD WITHOUT CONTRAST MRA HEAD WITHOUT CONTRAST MRA NECK WITHOUT AND WITH CONTRAST TECHNIQUE: Multiplanar, multi-echo pulse sequences of the brain and surrounding structures were acquired without intravenous contrast. Angiographic images of the Circle of Willis were acquired using MRA technique without intravenous contrast. Angiographic images of the neck were acquired using MRA technique without and with intravenous contrast. Carotid stenosis measurements (when applicable) are obtained utilizing NASCET criteria, using the distal internal carotid diameter as the denominator. CONTRAST:  9mL GADAVIST GADOBUTROL 1 MMOL/ML IV SOLN COMPARISON:  2013 FINDINGS: MRI HEAD Brain: There is no acute infarction or intracranial hemorrhage.  There is no intracranial mass, mass effect, or edema. There is no hydrocephalus or extra-axial fluid collection. Prominence of the ventricles and sulci reflects generalized parenchymal volume loss. Small chronic right thalamic and left 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 parietooccipital infarcts. Additional patchy foci of T2 hyperintensity in the supratentorial white matter are nonspecific but may reflect mild chronic microvascular ischemic changes. Punctate focus of susceptibility in the right parietal subcortical white matter is most compatible with a chronic microhemorrhage. Vascular: Major vessel flow voids at the skull base are preserved. Skull and upper cervical spine: Normal marrow signal is preserved. Sinuses/Orbits: Paranasal sinuses are aerated. Orbits are unremarkable. Other: Sella is unremarkable.  Mastoid air cells are clear. MRA HEAD Intracranial internal carotid arteries are patent. There is a 2 mm superiorly directed outpouching from the distal supraclinoid right ICA (series 3, images 103). And anterior cerebral arteries are patent. Intracranial vertebral arteries, basilar artery, posterior cerebral arteries are patent. Focal mild stenosis of the right P2 PCA. There is no significant stenosis. MRA NECK Common carotid arteries are patent, noting that proximal portions are not as well evaluated due to artifact. Internal and external carotid arteries are patent. Atherosclerosis is present at the ICA origins without hemodynamically significant stenosis. Stenosis measurement is suboptimal due to artifact. Partially retropharyngeal course. Extracranial vertebral arteries are patent, noting that the proximal V1 segments are not as well evaluated due to artifact. IMPRESSION: No acute infarction, hemorrhage, or mass. 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 described. Suboptimal vascular imaging of the neck due to motion artifact. No hemodynamically significant  stenosis. Mild right P2 PCA stenosis.  No high-grade intracranial stenosis. 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 Electronically Signed   By: Macy Mis M.D.   On: 01/27/2021 15:28   MR ANGIO NECK W WO CONTRAST  Result Date: 01/27/2021 CLINICAL DATA:  TIA EXAM: MRI HEAD WITHOUT CONTRAST MRA HEAD WITHOUT CONTRAST MRA  NECK WITHOUT AND WITH CONTRAST TECHNIQUE: Multiplanar, multi-echo pulse sequences of the brain and surrounding structures were acquired without intravenous contrast. Angiographic images of the Circle of Willis were acquired using MRA technique without intravenous contrast. Angiographic images of the neck were acquired using MRA technique without and with intravenous contrast. Carotid stenosis measurements (when applicable) are obtained utilizing NASCET criteria, using the distal internal carotid diameter as the denominator. CONTRAST:  42mL GADAVIST GADOBUTROL 1 MMOL/ML IV SOLN COMPARISON:  2013 FINDINGS: MRI HEAD Brain: There is no acute infarction or intracranial hemorrhage. There is no intracranial mass, mass effect, or edema. There is no hydrocephalus or extra-axial fluid collection. Prominence of the ventricles and sulci reflects generalized parenchymal volume loss. Small chronic right thalamic and left 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 parietooccipital infarcts. Additional patchy foci of T2 hyperintensity in the supratentorial white matter are nonspecific but may reflect mild chronic microvascular ischemic changes. Punctate focus of susceptibility in the right parietal subcortical white matter is most compatible with a chronic microhemorrhage. Vascular: Major vessel flow voids at the skull base are preserved. Skull and upper cervical spine: Normal marrow signal is preserved. Sinuses/Orbits: Paranasal sinuses are aerated. Orbits are unremarkable. Other: Sella is unremarkable.  Mastoid air cells are clear.  MRA HEAD Intracranial internal carotid arteries are patent. There is a 2 mm superiorly directed outpouching from the distal supraclinoid right ICA (series 3, images 103). And anterior cerebral arteries are patent. Intracranial vertebral arteries, basilar artery, posterior cerebral arteries are patent. Focal mild stenosis of the right P2 PCA. There is no significant stenosis. MRA NECK Common carotid arteries are patent, noting that proximal portions are not as well evaluated due to artifact. Internal and external carotid arteries are patent. Atherosclerosis is present at the ICA origins without hemodynamically significant stenosis. Stenosis measurement is suboptimal due to artifact. Partially retropharyngeal course. Extracranial vertebral arteries are patent, noting that the proximal V1 segments are not as well evaluated due to artifact. IMPRESSION: No acute infarction, hemorrhage, or mass. 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 described. Suboptimal vascular imaging of the neck due to motion artifact. No hemodynamically significant stenosis. Mild right P2 PCA stenosis.  No high-grade intracranial stenosis. 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 Electronically Signed   By: Macy Mis M.D.   On: 01/27/2021 15:28   MR BRAIN WO CONTRAST  Result Date: 01/27/2021 CLINICAL DATA:  TIA EXAM: MRI HEAD WITHOUT CONTRAST MRA HEAD WITHOUT CONTRAST MRA NECK WITHOUT AND WITH CONTRAST TECHNIQUE: Multiplanar, multi-echo pulse sequences of the brain and surrounding structures were acquired without intravenous contrast. Angiographic images of the Circle of Willis were acquired using MRA technique without intravenous contrast. Angiographic images of the neck were acquired using MRA technique without and with intravenous contrast. Carotid stenosis measurements (when applicable) are obtained utilizing NASCET criteria, using the distal  internal carotid diameter as the denominator. CONTRAST:  61mL GADAVIST GADOBUTROL 1 MMOL/ML IV SOLN COMPARISON:  2013 FINDINGS: MRI HEAD Brain: There is no acute infarction or intracranial hemorrhage. There is no intracranial mass, mass effect, or edema. There is no hydrocephalus or extra-axial fluid collection. Prominence of the ventricles and sulci reflects generalized parenchymal volume loss. Small chronic right thalamic and left 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 parietooccipital infarcts. Additional patchy foci of T2 hyperintensity in the supratentorial white matter are nonspecific but may reflect mild chronic microvascular ischemic changes. Punctate focus of susceptibility in the right parietal subcortical white matter is  most compatible with a chronic microhemorrhage. Vascular: Major vessel flow voids at the skull base are preserved. Skull and upper cervical spine: Normal marrow signal is preserved. Sinuses/Orbits: Paranasal sinuses are aerated. Orbits are unremarkable. Other: Sella is unremarkable.  Mastoid air cells are clear. MRA HEAD Intracranial internal carotid arteries are patent. There is a 2 mm superiorly directed outpouching from the distal supraclinoid right ICA (series 3, images 103). And anterior cerebral arteries are patent. Intracranial vertebral arteries, basilar artery, posterior cerebral arteries are patent. Focal mild stenosis of the right P2 PCA. There is no significant stenosis. MRA NECK Common carotid arteries are patent, noting that proximal portions are not as well evaluated due to artifact. Internal and external carotid arteries are patent. Atherosclerosis is present at the ICA origins without hemodynamically significant stenosis. Stenosis measurement is suboptimal due to artifact. Partially retropharyngeal course. Extracranial vertebral arteries are patent, noting that the proximal V1 segments are not as well evaluated due to artifact. IMPRESSION: No acute  infarction, hemorrhage, or mass. 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 described. Suboptimal vascular imaging of the neck due to motion artifact. No hemodynamically significant stenosis. Mild right P2 PCA stenosis.  No high-grade intracranial stenosis. 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 Electronically Signed   By: Macy Mis M.D.   On: 01/27/2021 15:28    EKG: personally reviewed my interpretation is normal EKG, normal sinus rhythm  ASSESSMENT & PLAN:  Assessment: Principal Problem:   TIA (transient ischemic attack) Active Problems:   Benign prostatic hyperplasia   Hypertension   Hyperlipidemia   William [name] is a 75 y.o. with pertinent PMH of hypertension, hyperlipidemia, gout, CVA (residual left hand numbness), colon cancer (2019, s/p surgery and chemo)  who presented with expressive aphasia and admit for TIA on hospital day 0  Plan: #TIA: #HLD: Patient presented with expressive aphasia that had resolved on admission.  Showed no acute intracranial abnormalities.  Follow-up MRI/MRA showed no acute infarction, hemorrhages or masses.  He does have some mild chronic microvascular ischemic changes and chronic right frontal and bilateral parieto-occipital infarcts.  No high-grade intracranial stenosis was seen.  Small ICA aneurysm was noted.  Patient was started on aspirin and Plavix. -Aspirin 81 mg, Plavix 75 mg -Atorvastatin 80 mg -Lipid panel and A1c pending -Echocardiogram pending -Admitted with cardiac telemetry -Frequent neurochecks -Passed swallow screen -PT/OT evaluation -Permissive hypertension with goal SBP of 160-180  #Hypertension Patient is on losartan 100 mg daily. -We will hold losartan for permissive hypertension -Ultimate goal will be normotensive  #BPH Continue tamsulosin  Best Practice: Diet: Cardiac diet IVF: Fluids: None, Rate: None VTE:    Enoxaparin Code: Full AB: None Status: Observation with expected length of stay less than 2 midnights. Anticipated Discharge Location:  Home Barriers to Discharge: Medical stability  Signature: Lawerance Cruel, D.O.  Internal Medicine Resident, PGY-3 [facility]  Pager: [phone] 5:13 PM, 01/27/2021   Please contact the on call pager after 5 pm and on weekends at [phone].

## 2021-01-27 NOTE — ED Notes (Signed)
Attempted to call report, RN will return call after reviewing the chart

## 2021-01-27 NOTE — ED Notes (Signed)
Pt resting on stretcher, visitor at bedside, meal tray delivered.

## 2021-01-27 NOTE — ED Provider Notes (Signed)
[facility] EMERGENCY DEPARTMENT Provider Note   CSN: [id number] Arrival date & time: 01/27/21  0820     History Chief Complaint  Patient presents with   Aphasia    William [name] is a 75 y.o. male.  HPI Patient presents with concern of an episode of expressive aphasia.  He is here with his wife who assists with the history.  1 episode lasted about 30 minutes, resolved prior to ED arrival.  Episode occurred while the patient was awake and alert, after awakening, eating breakfast. No clear precipitant, no clear alleviating or exacerbating factors. Currently has no complaints.  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 No recent illness, medication change, diet change. Patient takes antihypercholesterolemia and aspirin daily.    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    Past Surgical History:  Procedure Laterality Date   COLOSTOMY  2011   for diverticulitis   COLOSTOMY REVERSAL  2012   inghernia     INGUINAL HERNIA REPAIR     TOTAL HIP ARTHROPLASTY Right 08/14/2013   Procedure: RIGHT TOTAL HIP ARTHROPLASTY ANTERIOR APPROACH;  Surgeon: Mauri Pole, MD;  Location: [facility];  Service: Orthopedics;  Laterality: Right;       Family History  Problem Relation Age of Onset   Lymphoma Mother    Diverticulitis Mother    Heart disease Father    Stroke Father    Diverticulitis Brother     Social History   Tobacco  Use   Smoking status: Never   Smokeless tobacco: Never  Substance Use Topics   Alcohol use: No   Drug use: No    Home Medications Prior to Admission medications   Medication Sig Start Date End Date Taking? Authorizing Provider  allopurinol (ZYLOPRIM) 300 MG tablet Take 300 mg by mouth daily.   Yes [provider]  amLODipine (NORVASC) 10 MG tablet Take 10 mg by mouth every morning.    [provider]  docusate sodium 100 MG CAPS Take 100 mg by mouth 2 (two) times daily. 08/15/13   Danae Orleans, PA-C  ferrous sulfate 325 (65 FE) MG tablet Take 1 tablet (325 mg total) by mouth 3 (three) times daily after meals. 08/15/13   Danae Orleans, PA-C  HYDROcodone-acetaminophen (NORCO) 7.5-325 MG per tablet Take 1-2 tablets by mouth every 4 (four) hours as needed for moderate pain. 08/15/13   Danae Orleans, PA-C  metoprolol succinate (TOPROL-XL) 100 MG 24 hr tablet Take 100 mg by mouth 2 (two) times daily. Take with or immediately following a meal.    [provider]  polyethylene glycol (MIRALAX / GLYCOLAX) packet Take 17 g by mouth 2 (two) times daily. 08/15/13   Danae Orleans, PA-C  pravastatin (PRAVACHOL) 40 MG tablet Take 40 mg by mouth every morning.    [provider]  tamsulosin (FLOMAX) 0.4 MG CAPS capsule Take 0.4 mg by mouth daily.    [provider]  tiZANidine (ZANAFLEX) 4 MG tablet Take 1 tablet (4 mg total) by mouth every 6 (six) hours as needed for muscle spasms. 08/15/13   Danae Orleans, PA-C    Allergies    Patient has no known allergies.  Review of Systems   Review of Systems  Constitutional:        Per HPI, otherwise negative  HENT:         Per HPI, otherwise negative  Respiratory:         Per HPI, otherwise negative  Cardiovascular:        Per HPI, otherwise negative  Gastrointestinal:  Negative for vomiting.  Endocrine:       Negative aside from HPI  Genitourinary:        Neg aside from HPI   Musculoskeletal:         Per HPI, otherwise negative  Skin: Negative.   Neurological:  Positive for speech difficulty. Negative for syncope.   Physical Exam Updated Vital Signs BP (!) 141/77   Pulse 68   Temp 97.8 F (36.6 C) (Oral)   Resp 17   SpO2 96%   Physical Exam Vitals and nursing note reviewed.  Constitutional:      General: He is not in acute distress.    Appearance: He is well-developed.  HENT:     Head: Normocephalic and atraumatic.  Eyes:     Conjunctiva/sclera: Conjunctivae normal.  Cardiovascular:     Rate and Rhythm: Normal rate and regular rhythm.  Pulmonary:     Effort: Pulmonary effort is normal. No respiratory distress.     Breath sounds: No stridor.  Abdominal:     General: There is no distension.  Skin:    General: Skin is warm and dry.  Neurological:     General: No focal deficit present.     Mental Status: He is alert and oriented to person, place, and time.     Cranial Nerves: No cranial nerve deficit.     Motor: No weakness.     Coordination: Coordination normal.  Psychiatric:        Mood and Affect: Mood normal.        Behavior: Behavior normal.    ED Results / Procedures / Treatments   Labs (all labs ordered are listed, but only abnormal results are displayed) Labs Reviewed  CBC WITH DIFFERENTIAL/PLATELET - Abnormal; Notable for the following components:      Result Value   WBC 12.0 (*)    RDW 17.2 (*)    Platelets 505 (*)    Lymphs Abs 5.3 (*)    Monocytes Absolute 1.3 (*)    Eosinophils Absolute 0.6 (*)    All other components within normal limits  BASIC METABOLIC PANEL - Abnormal; Notable for the following components:   Glucose, Bld 110 (*)    Creatinine, Ser 1.37 (*)    GFR, Estimated 54 (*)    All other components within normal limits  PROTIME-INR    EKG EKG Interpretation  Date/Time:  Tuesday January 27 2021 08:23:46 EDT Ventricular Rate:  77 PR Interval:  154 QRS Duration: 92 QT Interval:  394 QTC Calculation: 445 R Axis:   24 Text  Interpretation: Normal sinus rhythm Normal ECG Confirmed by Carmin Muskrat [phone]) on 01/27/2021 2:17:12 PM  Radiology CT Head Wo Contrast  Result Date: 01/27/2021 CLINICAL DATA:  Slurred speech EXAM: CT HEAD WITHOUT  CONTRAST TECHNIQUE: Contiguous axial images were obtained from the base of the skull through the vertex without intravenous contrast. COMPARISON:  07/12/2012 FINDINGS: Brain: Chronic microvascular changes in the deep white matter. Mild age related volume loss. 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 No acute intracranial abnormality. Specifically, no hemorrhage, hydrocephalus, mass lesion, acute infarction, or significant intracranial injury. Vascular: No hyperdense vessel or unexpected calcification. Skull: No acute calvarial abnormality. Sinuses/Orbits: No acute findings Other: None IMPRESSION: No acute intracranial abnormality. Electronically Signed   By: Rolm Baptise M.D.   On: 01/27/2021 09:38    Procedures Procedures   Medications Ordered in ED Medications - No data to display  ED Course  I have reviewed the triage vital signs and the nursing notes.  Pertinent labs & imaging results that were available during my care of the patient were reviewed by me and considered in my medical decision making (see chart for details).  After initial evaluation with consideration of TIA I discussed this case with our neurology colleague.  With consideration of this patient has MRI ordered and will require admission for further evaluation of TIA, expressive aphasia.  2:17 PM CT unremarkable labs unremarkable, EKG noncontributory. Final Clinical Impression(s) / ED Diagnoses Final diagnoses:  Expressive aphasia  MDM Number of Diagnoses or Management Options Expressive aphasia: new, needed workup   Amount and/or Complexity of Data Reviewed Clinical lab tests: ordered and reviewed Tests in the radiology section of CPT: ordered and reviewed Tests in the medicine section of CPT: reviewed  and ordered Decide to obtain previous medical records or to obtain history from someone other than the patient: yes Obtain history from someone other than the patient: yes Review and summarize past medical records: yes Discuss the patient with other providers: yes Independent visualization of images, tracings, or specimens: yes  Risk of Complications, Morbidity, and/or Mortality Presenting problems: high Diagnostic procedures: high Management options: high  Critical Care Total time providing critical care: < 30 minutes  Patient Progress Patient progress: stable     Carmin Muskrat, MD 01/27/21 1419

## 2021-01-27 NOTE — ED Notes (Signed)
Patient transported to MRI 

## 2021-01-27 NOTE — Plan of Care (Signed)
  Problem: Education: Goal: Knowledge of disease or condition will improve Outcome: Progressing Goal: Knowledge of secondary prevention will improve Outcome: Progressing   Problem: Coping: Goal: Will verbalize positive feelings about self Outcome: Progressing

## 2021-01-27 NOTE — ED Provider Notes (Signed)
Emergency Medicine Provider Triage Evaluation Note  William [name] , [name] 75 y.o. male  was evaluated in triage.  Pt complains of difficulty speaking.  Patient states for 30 minutes this morning he had difficulty getting certain words out. Denies slurred speech. Occurred with only certain words. No HA, vision changes, weakness, numbness. Symptoms resolved prior to arrival. Prior CVA 8 years ago  Review of Systems  Positive: Difficulty speaking Negative: Numbness, HA, weakness  Physical Exam  BP (!) 149/91   Pulse 79   Temp 98.2 F (36.8 C)   Resp 14   SpO2 96%  Gen:   Awake, no distress   Resp:  Normal effort  MSK:   Moves extremities without difficulty  Other:  Cn 2-12 grossly intact. No facial droop, equal strength bilaterally  Medical Decision Making  Medically screening exam initiated at 8:36 AM.  Appropriate orders placed.  William [name] was informed that the remainder of the evaluation will be completed by another provider, this initial triage assessment does not replace that evaluation, and the importance of remaining in the ED until their evaluation is complete.  Slurred speech, resolved PTA  Nursing notified patient needs room in back   William [name] A, PA-C 01/27/21 1660    William Saver, MD 01/28/21 [phone]

## 2021-01-28 ENCOUNTER — Observation Stay (HOSPITAL_COMMUNITY): Payer: Medicare Other

## 2021-01-28 ENCOUNTER — Observation Stay (HOSPITAL_BASED_OUTPATIENT_CLINIC_OR_DEPARTMENT_OTHER): Payer: Medicare Other

## 2021-01-28 ENCOUNTER — Other Ambulatory Visit: Payer: Self-pay | Admitting: Neurology

## 2021-01-28 DIAGNOSIS — I10 Essential (primary) hypertension: Secondary | ICD-10-CM

## 2021-01-28 DIAGNOSIS — E7800 Pure hypercholesterolemia, unspecified: Secondary | ICD-10-CM | POA: Diagnosis not present

## 2021-01-28 DIAGNOSIS — R4701 Aphasia: Secondary | ICD-10-CM | POA: Diagnosis not present

## 2021-01-28 DIAGNOSIS — G459 Transient cerebral ischemic attack, unspecified: Secondary | ICD-10-CM

## 2021-01-28 DIAGNOSIS — Z9989 Dependence on other enabling machines and devices: Secondary | ICD-10-CM | POA: Diagnosis not present

## 2021-01-28 DIAGNOSIS — G4733 Obstructive sleep apnea (adult) (pediatric): Secondary | ICD-10-CM | POA: Diagnosis not present

## 2021-01-28 LAB — BASIC METABOLIC PANEL
Anion gap: 7 (ref 5–15)
BUN: 21 mg/dL (ref 8–23)
CO2: 28 mmol/L (ref 22–32)
Calcium: 9.6 mg/dL (ref 8.9–10.3)
Chloride: 103 mmol/L (ref 98–111)
Creatinine, Ser: 1.44 mg/dL — ABNORMAL HIGH (ref 0.61–1.24)
GFR, Estimated: 51 mL/min — ABNORMAL LOW (ref >60)
Glucose, Bld: 128 mg/dL — ABNORMAL HIGH (ref 70–99)
Potassium: 4.6 mmol/L (ref 3.5–5.1)
Sodium: 138 mmol/L (ref 135–145)

## 2021-01-28 LAB — LIPID PANEL
Cholesterol: 202 mg/dL — ABNORMAL HIGH (ref 0–200)
HDL: 29 mg/dL — ABNORMAL LOW (ref >40)
LDL Cholesterol: 118 mg/dL — ABNORMAL HIGH (ref 0–99)
Total CHOL/HDL Ratio: 7 RATIO
Triglycerides: 276 mg/dL — ABNORMAL HIGH (ref <150)
VLDL: 55 mg/dL — ABNORMAL HIGH (ref 0–40)

## 2021-01-28 LAB — ECHOCARDIOGRAM COMPLETE
Area-P 1/2: 2.75 cm2
Height: 62 in
S' Lateral: 3.4 cm
Weight: 3382.74 oz

## 2021-01-28 LAB — SARS CORONAVIRUS 2 (TAT 6-24 HRS): SARS Coronavirus 2: NEGATIVE

## 2021-01-28 MED ORDER — RAMELTEON 8 MG PO TABS
8.0000 mg | ORAL_TABLET | Freq: Every day | ORAL | Status: DC
  Administered 2021-01-28: 8 mg via ORAL
  Filled 2021-01-28 (×2): qty 1

## 2021-01-28 MED ORDER — METFORMIN HCL 500 MG PO TABS: 500.0000 mg | ORAL_TABLET | Freq: Two times a day (BID) | ORAL | Status: DC

## 2021-01-28 MED ORDER — ASPIRIN EC 81 MG PO TBEC
81.0000 mg | DELAYED_RELEASE_TABLET | Freq: Every day | ORAL | Status: DC
  Administered 2021-01-28: 81 mg via ORAL
  Filled 2021-01-28: qty 1

## 2021-01-28 MED ORDER — ATORVASTATIN CALCIUM 40 MG PO TABS
40.0000 mg | ORAL_TABLET | Freq: Every day | ORAL | Status: DC
  Administered 2021-01-28: 40 mg via ORAL
  Filled 2021-01-28: qty 1

## 2021-01-28 MED ORDER — CLOPIDOGREL BISULFATE 75 MG PO TABS: 75.0000 mg | ORAL_TABLET | Freq: Every day | ORAL | 0 refills | Status: AC

## 2021-01-28 MED ORDER — ATORVASTATIN CALCIUM 40 MG PO TABS: 40.0000 mg | ORAL_TABLET | Freq: Every day | ORAL | 0 refills | Status: AC

## 2021-01-28 MED ORDER — ASPIRIN 81 MG PO TBEC: 81.0000 mg | DELAYED_RELEASE_TABLET | Freq: Every day | ORAL | 0 refills | Status: DC

## 2021-01-28 MED ORDER — METFORMIN HCL 500 MG PO TABS: 500.0000 mg | ORAL_TABLET | Freq: Two times a day (BID) | ORAL | 0 refills | Status: AC

## 2021-01-28 MED ORDER — CLOPIDOGREL BISULFATE 75 MG PO TABS
75.0000 mg | ORAL_TABLET | Freq: Every day | ORAL | Status: DC
  Administered 2021-01-28: 75 mg via ORAL
  Filled 2021-01-28: qty 1

## 2021-01-28 NOTE — Discharge Instructions (Addendum)
You were admitted to the hospital for a transient ischemic attack (TIA) and started on medications to decrease your risk of a stroke. You will take Aspirin and Plavix together daily as prescribed for 3 weeks. After 3 weeks, you will take plavix by itself. You will take your new statin called atorvastatin daily as prescribed. Your A1C was high at 7.3, so we have added a new medication called metformin, which you will take twice a day with meals. Your blood pressures were normal today, so we have not yet restarted your amlodipine or losartan. You should restart your amlodipine and/or losartan if needed for high blood pressures under the direction of your primary care physician.

## 2021-01-28 NOTE — Progress Notes (Signed)
  Echocardiogram 2D Echocardiogram has been performed.  William [name] M 01/28/2021, 1:07 PM

## 2021-01-28 NOTE — Progress Notes (Signed)
STROKE TEAM PROGRESS NOTE   SUBJECTIVE (INTERVAL HISTORY) His wife is at the bedside.  Overall his condition is completely resolved. Pt stated that he had one episode of expressive aphasia lasted 30-30min yesterday morning. Now back to baseline. He has no hx of DM but A1C this time 7.4. he denies any heart palpitation but he has sleep study tonight at 8pm. He is eager to go home first and then go to the sleepy lab at [facility].    OBJECTIVE Temp:  [97.7 F (36.5 C)-98.1 F (36.7 C)] 98.1 F (36.7 C) (07/13 0504) Pulse Rate:  [59-77] 77 (07/13 0504) Resp:  [16-20] 20 (07/13 0504) BP: (110-148)/(65-101) 110/65 (07/13 0504) SpO2:  [91 %-97 %] 93 % (07/13 0504) Weight:  [95.9 kg-101 kg] 95.9 kg (07/12 2219)  No results for input(s): GLUCAP in the last 168 hours. Recent Labs  Lab 01/27/21 0834 01/28/21 0028  NA 135 138  K 4.6 4.6  CL 102 103  CO2 23 28  GLUCOSE 110* 128*  BUN 20 21  CREATININE 1.37* 1.44*  CALCIUM 9.7 9.6  MG 2.0  --   PHOS 3.7  --    No results for input(s): AST, ALT, ALKPHOS, BILITOT, PROT, ALBUMIN in the last 168 hours. Recent Labs  Lab 01/27/21 0834  WBC 12.0*  NEUTROABS 4.8  HGB 15.5  HCT 47.0  MCV 96.7  PLT 505*   No results for input(s): CKTOTAL, CKMB, CKMBINDEX, TROPONINI in the last 168 hours. Recent Labs    01/27/21 0834  LABPROT 11.9  INR 0.9   No results for input(s): COLORURINE, LABSPEC, PHURINE, GLUCOSEU, HGBUR, BILIRUBINUR, KETONESUR, PROTEINUR, UROBILINOGEN, NITRITE, LEUKOCYTESUR in the last 72 hours.  Invalid input(s): APPERANCEUR     Component Value Date/Time   CHOL 202 (H) 01/28/2021 0028   TRIG 276 (H) 01/28/2021 0028   HDL 29 (L) 01/28/2021 0028   CHOLHDL 7.0 01/28/2021 0028   VLDL 55 (H) 01/28/2021 0028   LDLCALC 118 (H) 01/28/2021 0028   Lab Results  Component Value Date   HGBA1C 7.4 (H) 01/27/2021   No results found for: LABOPIA, COCAINSCRNUR, LABBENZ, AMPHETMU, THCU, LABBARB  No results for input(s): ETH in the  last 168 hours.  I have personally reviewed the radiological images below and agree with the radiology interpretations.  CT Head Wo Contrast  Result Date: 01/27/2021 CLINICAL DATA:  Slurred speech EXAM: CT HEAD WITHOUT CONTRAST TECHNIQUE: Contiguous axial images were obtained from the base of the skull through the vertex without intravenous contrast. COMPARISON:  07/12/2012 FINDINGS: Brain: Chronic microvascular changes in the deep white matter. Mild age related volume loss. 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 No acute intracranial abnormality. Specifically, no hemorrhage, hydrocephalus, mass lesion, acute infarction, or significant intracranial injury. Vascular: No hyperdense vessel or unexpected calcification. Skull: No acute calvarial abnormality. Sinuses/Orbits: No acute findings Other: None IMPRESSION: No acute intracranial abnormality. Electronically Signed   By: Rolm Baptise M.D.   On: 01/27/2021 09:38   MR ANGIO HEAD WO CONTRAST  Result Date: 01/27/2021 CLINICAL DATA:  TIA EXAM: MRI HEAD WITHOUT CONTRAST MRA HEAD WITHOUT CONTRAST MRA NECK WITHOUT AND WITH CONTRAST TECHNIQUE: Multiplanar, multi-echo pulse sequences of the brain and surrounding structures were acquired without intravenous contrast. Angiographic images of the Circle of Willis were acquired using MRA technique without intravenous contrast. Angiographic images of the neck were acquired using MRA technique without and with intravenous contrast. Carotid stenosis measurements (when applicable) are obtained utilizing NASCET criteria, using the distal internal carotid diameter  as the denominator. CONTRAST:  15mL GADAVIST GADOBUTROL 1 MMOL/ML IV SOLN COMPARISON:  2013 FINDINGS: MRI HEAD Brain: There is no acute infarction or intracranial hemorrhage. There is no intracranial mass, mass effect, or edema. There is no hydrocephalus or extra-axial fluid collection. Prominence of the ventricles and sulci reflects generalized parenchymal volume  loss. Small chronic right thalamic and left 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 parietooccipital infarcts. Additional patchy foci of T2 hyperintensity in the supratentorial white matter are nonspecific but may reflect mild chronic microvascular ischemic changes. Punctate focus of susceptibility in the right parietal subcortical white matter is most compatible with a chronic microhemorrhage. Vascular: Major vessel flow voids at the skull base are preserved. Skull and upper cervical spine: Normal marrow signal is preserved. Sinuses/Orbits: Paranasal sinuses are aerated. Orbits are unremarkable. Other: Sella is unremarkable.  Mastoid air cells are clear. MRA HEAD Intracranial internal carotid arteries are patent. There is a 2 mm superiorly directed outpouching from the distal supraclinoid right ICA (series 3, images 103). And anterior cerebral arteries are patent. Intracranial vertebral arteries, basilar artery, posterior cerebral arteries are patent. Focal mild stenosis of the right P2 PCA. There is no significant stenosis. MRA NECK Common carotid arteries are patent, noting that proximal portions are not as well evaluated due to artifact. Internal and external carotid arteries are patent. Atherosclerosis is present at the ICA origins without hemodynamically significant stenosis. Stenosis measurement is suboptimal due to artifact. Partially retropharyngeal course. Extracranial vertebral arteries are patent, noting that the proximal V1 segments are not as well evaluated due to artifact. IMPRESSION: No acute infarction, hemorrhage, or mass. 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 described. Suboptimal vascular imaging of the neck due to motion artifact. No hemodynamically significant stenosis. Mild right P2 PCA stenosis.  No high-grade intracranial stenosis. 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 Electronically  Signed   By: Macy Mis M.D.   On: 01/27/2021 15:28   MR ANGIO NECK W WO CONTRAST  Result Date: 01/27/2021 CLINICAL DATA:  TIA EXAM: MRI HEAD WITHOUT CONTRAST MRA HEAD WITHOUT CONTRAST MRA NECK WITHOUT AND WITH CONTRAST TECHNIQUE: Multiplanar, multi-echo pulse sequences of the brain and surrounding structures were acquired without intravenous contrast. Angiographic images of the Circle of Willis were acquired using MRA technique without intravenous contrast. Angiographic images of the neck were acquired using MRA technique without and with intravenous contrast. Carotid stenosis measurements (when applicable) are obtained utilizing NASCET criteria, using the distal internal carotid diameter as the denominator. CONTRAST:  52mL GADAVIST GADOBUTROL 1 MMOL/ML IV SOLN COMPARISON:  2013 FINDINGS: MRI HEAD Brain: There is no acute infarction or intracranial hemorrhage. There is no intracranial mass, mass effect, or edema. There is no hydrocephalus or extra-axial fluid collection. Prominence of the ventricles and sulci reflects generalized parenchymal volume loss. Small chronic right thalamic and left 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 parietooccipital infarcts. Additional patchy foci of T2 hyperintensity in the supratentorial white matter are nonspecific but may reflect mild chronic microvascular ischemic changes. Punctate focus of susceptibility in the right parietal subcortical white matter is most compatible with a chronic microhemorrhage. Vascular: Major vessel flow voids at the skull base are preserved. Skull and upper cervical spine: Normal marrow signal is preserved. Sinuses/Orbits: Paranasal sinuses are aerated. Orbits are unremarkable. Other: Sella is unremarkable.  Mastoid air cells are clear. MRA HEAD Intracranial internal carotid arteries are patent. There is a 2 mm superiorly directed outpouching from the distal supraclinoid  right ICA (series 3, images 103). And anterior cerebral  arteries are patent. Intracranial vertebral arteries, basilar artery, posterior cerebral arteries are patent. Focal mild stenosis of the right P2 PCA. There is no significant stenosis. MRA NECK Common carotid arteries are patent, noting that proximal portions are not as well evaluated due to artifact. Internal and external carotid arteries are patent. Atherosclerosis is present at the ICA origins without hemodynamically significant stenosis. Stenosis measurement is suboptimal due to artifact. Partially retropharyngeal course. Extracranial vertebral arteries are patent, noting that the proximal V1 segments are not as well evaluated due to artifact. IMPRESSION: No acute infarction, hemorrhage, or mass. 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 described. Suboptimal vascular imaging of the neck due to motion artifact. No hemodynamically significant stenosis. Mild right P2 PCA stenosis.  No high-grade intracranial stenosis. 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 Electronically Signed   By: Macy Mis M.D.   On: 01/27/2021 15:28   MR BRAIN WO CONTRAST  Result Date: 01/27/2021 CLINICAL DATA:  TIA EXAM: MRI HEAD WITHOUT CONTRAST MRA HEAD WITHOUT CONTRAST MRA NECK WITHOUT AND WITH CONTRAST TECHNIQUE: Multiplanar, multi-echo pulse sequences of the brain and surrounding structures were acquired without intravenous contrast. Angiographic images of the Circle of Willis were acquired using MRA technique without intravenous contrast. Angiographic images of the neck were acquired using MRA technique without and with intravenous contrast. Carotid stenosis measurements (when applicable) are obtained utilizing NASCET criteria, using the distal internal carotid diameter as the denominator. CONTRAST:  39mL GADAVIST GADOBUTROL 1 MMOL/ML IV SOLN COMPARISON:  2013 FINDINGS: MRI HEAD Brain: There is no acute infarction or intracranial  hemorrhage. There is no intracranial mass, mass effect, or edema. There is no hydrocephalus or extra-axial fluid collection. Prominence of the ventricles and sulci reflects generalized parenchymal volume loss. Small chronic right thalamic and left 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 parietooccipital infarcts. Additional patchy foci of T2 hyperintensity in the supratentorial white matter are nonspecific but may reflect mild chronic microvascular ischemic changes. Punctate focus of susceptibility in the right parietal subcortical white matter is most compatible with a chronic microhemorrhage. Vascular: Major vessel flow voids at the skull base are preserved. Skull and upper cervical spine: Normal marrow signal is preserved. Sinuses/Orbits: Paranasal sinuses are aerated. Orbits are unremarkable. Other: Sella is unremarkable.  Mastoid air cells are clear. MRA HEAD Intracranial internal carotid arteries are patent. There is a 2 mm superiorly directed outpouching from the distal supraclinoid right ICA (series 3, images 103). And anterior cerebral arteries are patent. Intracranial vertebral arteries, basilar artery, posterior cerebral arteries are patent. Focal mild stenosis of the right P2 PCA. There is no significant stenosis. MRA NECK Common carotid arteries are patent, noting that proximal portions are not as well evaluated due to artifact. Internal and external carotid arteries are patent. Atherosclerosis is present at the ICA origins without hemodynamically significant stenosis. Stenosis measurement is suboptimal due to artifact. Partially retropharyngeal course. Extracranial vertebral arteries are patent, noting that the proximal V1 segments are not as well evaluated due to artifact. IMPRESSION: No acute infarction, hemorrhage, or mass. 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 described. Suboptimal vascular imaging of the neck due to motion artifact. No hemodynamically  significant stenosis. Mild right P2 PCA stenosis.  No high-grade intracranial stenosis. 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 Electronically Signed   By: Macy Mis M.D.   On: 01/27/2021 15:28  PHYSICAL EXAM  Temp:  [97.7 F (36.5 C)-98.1 F (36.7 C)] 98.1 F (36.7 C) (07/13 0504) Pulse Rate:  [59-77] 77 (07/13 0504) Resp:  [16-20] 20 (07/13 0504) BP: (110-148)/(65-101) 110/65 (07/13 0504) SpO2:  [91 %-97 %] 93 % (07/13 0504) Weight:  [95.9 kg-101 kg] 95.9 kg (07/12 2219)  General - Well nourished, well developed, in no apparent distress.  Ophthalmologic - fundi not visualized due to noncooperation.  Cardiovascular - Regular rhythm and rate.  Mental Status -  Level of arousal and orientation to time, place, and person were intact. Language including expression, naming, repetition, comprehension was assessed and found intact. Fund of Knowledge was assessed and was intact.  Cranial Nerves II - XII - II - Visual field intact OU. III, IV, VI - Extraocular movements intact. V - Facial sensation intact bilaterally. VII - Facial movement intact bilaterally. VIII - Hearing & vestibular intact bilaterally. X - Palate elevates symmetrically. XI - Chin turning & shoulder shrug intact bilaterally. XII - Tongue protrusion intact.  Motor Strength - The patient's strength was normal in all extremities and pronator drift was absent.  Bulk was normal and fasciculations were absent.   Motor Tone - Muscle tone was assessed at the neck and appendages and was normal.  Reflexes - The patient's reflexes were symmetrical in all extremities and he had no pathological reflexes.  Sensory - Light touch, temperature/pinprick were assessed and were symmetrical.    Coordination - The patient had normal movements in the hands and feet with no ataxia or dysmetria.  Tremor was absent.  Gait and Station -  deferred.   ASSESSMENT/PLAN Mr. Jaxton Casale is a 75 y.o. male with history of HTN, HLD, CKD, colon Ca s/p surgery and chemo, stroke in 2013 and ? OSA admitted for episode of expressive aphasia. No tPA given due to outside window and symptoms resolved.    TIA:  left brain TIA, concerning for cardioembolic source CT neg for acute finding MRI  no infarct MRA head and neck unremarkable  EEG within normal limits 2D Echo EF 55 to 60% Recommend 30 day cardiac event monitoring as outpt to rule out afib LDL 141 HgbA1c 7.4 lovenox for VTE prophylaxis aspirin 325 mg daily prior to admission, now on aspirin 81 mg daily and clopidogrel 75 mg daily DAPT for 3 weeks and then plavix alone Patient counseled to be compliant with his antithrombotic medications Ongoing aggressive stroke risk factor management Therapy recommendations:  none Disposition:  home  Diabetes - new diagnosis HgbA1c 7.4 goal < 7.0 New diagnosis CBG monitoring SSI DM education and close PCP follow up for DM management  Hypertension Stable Long term BP goal normotensive  Hyperlipidemia Home meds:  pravastatin 40 but not compliant with meds  LDL 141, goal < 70 Now on lipitor 40 Continue statin at discharge  Other Stroke Risk Factors Advanced age Obesity, Body mass index is 38.67 kg/m.  Hx stroke/TIA in 2013, details not clear Possible Obstructive sleep apnea - will have sleep study tonight  Other Active Problems Colon ca s/p sx and chemo  Hospital day # 0  Neurology will sign off. Please call with questions. Pt will follow up with stroke clinic NP at [facility] in about 4 weeks. Thanks for the consult.   Rosalin Hawking, MD PhD Stroke Neurology 01/28/2021 12:26 PM    To contact Stroke Continuity provider, please refer to [email or web address] After hours, contact General Neurology

## 2021-01-28 NOTE — Progress Notes (Signed)
EEG complete - results pending 

## 2021-01-28 NOTE — Evaluation (Signed)
Physical Therapy Evaluation and Discharge Patient Details Name: William [name] MRN: [id number] DOB: 10-26-1945 Today's Date: 01/28/2021   History of Present Illness  75 y.o. male presented 01/27/21 to [facility] with word finding difficulties. Symptoms resolved in ED. Likely TIA as MRI negative for acute changes   PMH of hypertension, hyperlipidemia, gout, CVA (residual left hand numbness), colon cancer (2019, s/p surgery and chemo)  Clinical Impression   Patient evaluated by Physical Therapy with no further acute PT needs identified. Patient ambulated independently in hallway and scored 52/56 on Berg Balance Assessment. 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 Discussed slight tendency to lean to his rt, however no drift or imbalance noted. Educated if he feels balance is impaired and effecting his work that he should ask at followup MD appointment for a referral to [facility].  PT is signing off. Thank you for this referral.     Follow Up Recommendations No PT follow up    Equipment Recommendations  None recommended by PT    Recommendations for Other Services       Precautions / Restrictions Precautions Precautions: None      Mobility  Bed Mobility Overal bed mobility: Independent                  Transfers Overall transfer level: Independent                  Ambulation/Gait Ambulation/Gait assistance: Independent Gait Distance (Feet): 180 Feet Assistive device: None Gait Pattern/deviations: WFL(Within Functional Limits)   Gait velocity interpretation: >2.62 ft/sec, indicative of community ambulatory General Gait Details: able to vary speed and turn head without imbalance  Stairs            Wheelchair Mobility    Modified Rankin (Stroke Patients Only)       Balance Overall balance assessment: Needs assistance                   Tandem Stance - Right Leg: 20 Tandem Stance - Left Leg: 20 Rhomberg -  Eyes Opened: 30 Rhomberg - Eyes Closed: 15 (slight sway to his rt) High level balance activites: Direction changes;Turns;Sudden stops;Head turns High Level Balance Comments: no issues Standardized Balance Assessment Standardized Balance Assessment : Berg Balance Test Berg Balance Test Sit to Stand: Able to stand without using hands and stabilize independently Standing Unsupported: Able to stand safely 2 minutes Sitting with Back Unsupported but Feet Supported on Floor or Stool: Able to sit safely and securely 2 minutes Stand to Sit: Sits safely with minimal use of hands Transfers: Able to transfer safely, minor use of hands Standing Unsupported with Eyes Closed: Able to stand 10 seconds safely Standing Ubsupported with Feet Together: Able to place feet together independently and stand 1 minute safely From Standing, Reach Forward with Outstretched Arm: Can reach confidently >25 cm (10") From Standing Position, Pick up Object from Floor: Able to pick up shoe safely and easily From Standing Position, Turn to Look Behind Over each Shoulder: Looks behind from both sides and weight shifts well Turn 360 Degrees: Able to turn 360 degrees safely in 4 seconds or less Standing Unsupported, Alternately Place Feet on Step/Stool: Able to stand independently and safely and complete 8 steps in 20 seconds Standing Unsupported, One Foot in Front: Able to plae foot ahead of the other independently and hold 30 seconds Standing on One Leg: Tries to lift leg/unable to hold 3 seconds but remains standing independently  Total Score: 52         Pertinent Vitals/Pain Pain Assessment: No/denies pain    Home Living Family/patient expects to be discharged to:: Private residence Living Arrangements: Spouse/significant other Available Help at Discharge: Family;Available 24 hours/day Type of Home: House Home Access: Level entry     Home Layout: One level Home Equipment: Marten - standard;Shower seat       Prior Function Level of Independence: Independent         Comments: works in [employer]   Dominant Hand: Right    Extremity/Trunk Assessment   Upper Extremity Assessment Upper Extremity Assessment: Overall WFL for tasks assessed    Lower Extremity Assessment Lower Extremity Assessment: Overall WFL for tasks assessed    Cervical / Trunk Assessment Cervical / Trunk Assessment: Other exceptions Cervical / Trunk Exceptions: overweight  Communication   Communication: No difficulties  Cognition Arousal/Alertness: Awake/alert Behavior During Therapy: WFL for tasks assessed/performed Overall Cognitive Status: Within Functional Limits for tasks assessed                                        General Comments      Exercises     Assessment/Plan    PT Assessment Patent does not need any further PT services  PT Problem List         PT Treatment Interventions      PT Goals (Current goals can be found in the Care Plan section)  Acute Rehab PT Goals Patient Stated Goal: go home today PT Goal Formulation: All assessment and education complete, DC therapy    Frequency     Barriers to discharge        Co-evaluation               AM-PAC PT "6 Clicks" Mobility  Outcome Measure Help needed turning from your back to your side while in a flat bed without using bedrails?: None Help needed moving from lying on your back to sitting on the side of a flat bed without using bedrails?: None Help needed moving to and from a bed to a chair (including a wheelchair)?: None Help needed standing up from a chair using your arms (e.g., wheelchair or bedside chair)?: None Help needed to walk in hospital room?: None Help needed climbing 3-5 steps with a railing? : None 6 Click Score: 24    End of Session   Activity Tolerance: Patient tolerated treatment well Patient left: in chair;with call bell/phone within reach Nurse  Communication: Mobility status;Other (comment) (no PT or DME needs) PT Visit Diagnosis: Unsteadiness on feet (R26.81)    Time: 9892-1194 PT Time Calculation (min) (ACUTE ONLY): 21 min   Charges:   PT Evaluation $PT Eval Moderate Complexity: 1 Mod           Arby Barrette, PT Pager [phone]   Rexanne Mano 01/28/2021, 9:38 AM

## 2021-01-28 NOTE — Discharge Summary (Addendum)
Name: William [name] MRN: [id number] DOB: 1946/01/11 75 y.o. PCP: Penelope Coop, FNP  Date of Admission: 01/27/2021  8:21 AM Date of Discharge: 01/28/2021 Attending Physician: No att. providers found  Discharge Diagnosis: 1. TIA 2. Hyperlipidemia 3. Diabetes mellitus, new diagnosis 4. Hypertension  Discharge Medications: Allergies as of 01/28/2021   No Known Allergies      Medication List     STOP taking these medications    amLODipine 10 MG tablet Commonly known as: NORVASC   DSS 100 MG Caps   ferrous sulfate 325 (65 FE) MG tablet   HYDROcodone-acetaminophen 7.5-325 MG tablet Commonly known as: NORCO   losartan 100 MG tablet Commonly known as: COZAAR   magnesium oxide 400 MG tablet Commonly known as: MAG-OX   polyethylene glycol 17 g packet Commonly known as: MIRALAX / GLYCOLAX   pravastatin 40 MG tablet Commonly known as: PRAVACHOL   tiZANidine 4 MG tablet Commonly known as: ZANAFLEX   Vitamin D (Ergocalciferol) 1.25 MG (50000 UNIT) Caps capsule Commonly known as: DRISDOL       TAKE these medications    allopurinol 300 MG tablet Commonly known as: ZYLOPRIM Take 300 mg by mouth daily.   aspirin 81 MG EC tablet Take 1 tablet (81 mg total) by mouth daily. Swallow whole. Start taking on: January 29, 2021 What changed:  medication strength how much to take additional instructions   atorvastatin 40 MG tablet Commonly known as: LIPITOR Take 1 tablet (40 mg total) by mouth daily. Start taking on: January 29, 2021   Cholecalciferol 125 MCG (5000 UT) Tabs Take 1 tablet by mouth daily.   clopidogrel 75 MG tablet Commonly known as: PLAVIX Take 1 tablet (75 mg total) by mouth daily. Start taking on: January 29, 2021   finasteride 5 MG tablet Commonly known as: PROSCAR Take 5 mg by mouth daily.   metFORMIN 500 MG tablet Commonly known as: GLUCOPHAGE Take 1 tablet (500 mg total) by mouth 2 (two) times daily with a meal.   tamsulosin 0.4  MG Caps capsule Commonly known as: FLOMAX Take 0.4 mg by mouth daily.   zinc gluconate 50 MG tablet Take 50 mg by mouth daily.       Disposition and follow-up:   [name] was discharged from [facility] in Good condition. At the hospital follow up visit please address:  1.  TIA, Hyperlipidemia, DM: Pt presented with expressive aphasia that was 30-45 min in duration but resolved by the time he was in the ED. During his admission, he had elevated TG's, LDL, cholesterol, and an A1C of 7.3. No signs of Afib on EKG. He was started on DAPT, atorvastatin, and metformin regimens. Neuro recommends a 30 day cardiac event monitoring as outpatient to rule out Afib. Also recommended DAPT for 3 weeks followed by Plavix alone.  2. Hypertension: He is on home amlodipine 100 mg daily and losartan 100 mg daily. We held these medications to allow for permissive hypertension. Resume as needed if patient becomes hypertensive.  3.  Labs / imaging needed at time of follow-up: None  4.  Pending labs/ test needing follow-up: Echo pending at discharge  Follow-up Appointments: Stroke clinic NP at [place] by problem list: 1. TIA, Hyperlipidemia Patient presented with expressive aphasia that had resolved on admission. Head CT showed no acute intracranial abnormalities. Follow-up MRI/MRA showed no acute infarction, hemorrhages or masses.  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 No high-grade intracranial stenosis was seen. Small ICA aneurysm was noted. Patient was started on DAPT (aspirin 81 mg daily and plavix 75 mg daily) and atorvastatin 40 mg. He underwent echo which was pending at discharge. He has been stable and has had frequent neuro checks during his admission.  2. Diabetes mellitus - new diagnosis: Pt does not have a history of DM. During his admission, he had an A1C of  7.3. He was subsequently started on metformin 500 mg PO BID which will be continued after discharge.   3. Hypertension: Pt's home amlodipine and losartan were held at admission to allow for permissive hypertension. Pt was normotensive off of these medications at discharge. Resume as needed if patient becomes hypertensive.  Otherwise, patient's home medications were continued for his other comorbidities.    Discharge Exam:   BP 104/71 (BP Location: Left Arm)   Pulse 65   Temp 98.3 F (36.8 C) (Oral)   Resp 16   Ht 5\' 2"  (1.575 m)   Wt 95.9 kg   SpO2 94%   BMI 38.67 kg/m  Discharge exam:  General: Well-developed, well-nourished, NAD HEENT: NCAT, moist mucus membranes, PERRL Heart: Regular rate and rhythm, no murmurs/rubs/gallops, No BLE edema.  Pulm: CTAB, normal respiratory effort ABD: Nontender, nondistended, bowel sounds present throughout Extrm: Warm and dry Psych: Normal mood and affect  Neurologic exam: Mental status: A&Ox3, speech clear and fluent with normal comprehension Cranial Nerves:             II: PERRL             III, IV, VI: Extra-occular motions intact bilaterally             V, VII: Face symmetric, sensation intact in all 3 divisions               VIII: hearing normal to rubbing fingers bilaterally               IX, X: palate rises symmetrically             XI:  Shoulder shrug normal bilaterally               XII: tongue midline    Motor: Strength 5/5 on all upper and lower extremities, bulk muscle and tone are normal  Sensory: Light touch intact and symmetric bilaterally  Coordination: There is no dysmetria on finger-to-nose. Rapid alternating movement test normal.   Pertinent Labs, Studies, and Procedures:  Lipid Panel     Component Value Date/Time   CHOL 202 (H) 01/28/2021 0028   TRIG 276 (H) 01/28/2021 0028   HDL 29 (L) 01/28/2021 0028   CHOLHDL 7.0 01/28/2021 0028   VLDL 55 (H) 01/28/2021 0028   LDLCALC 118 (H) 01/28/2021 0028   A1C: 7.3 PT:  11.9 INR: 0.9  CT Head Wo Contrast Result Date: 01/27/2021 CLINICAL DATA:  Slurred speech EXAM: CT HEAD WITHOUT CONTRAST TECHNIQUE: Contiguous axial images were obtained from the base of the skull through the vertex without intravenous contrast. COMPARISON:  07/12/2012 FINDINGS: Brain: Chronic microvascular changes in the deep white matter. Mild age related volume loss. 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 No acute intracranial abnormality. Specifically, no hemorrhage, hydrocephalus, mass lesion, acute infarction, or significant intracranial injury. Vascular: No hyperdense vessel or unexpected calcification. Skull: No acute calvarial abnormality. Sinuses/Orbits: No acute findings Other: None IMPRESSION: No acute intracranial abnormality. Electronically Signed   By: Rolm Baptise M.D.  On: 01/27/2021 09:38   MR ANGIO HEAD WO CONTRAST  Result Date: 01/27/2021 CLINICAL DATA:  TIA EXAM: MRI HEAD WITHOUT CONTRAST MRA HEAD WITHOUT CONTRAST MRA NECK WITHOUT AND WITH CONTRAST TECHNIQUE: Multiplanar, multi-echo pulse sequences of the brain and surrounding structures were acquired without intravenous contrast. Angiographic images of the Circle of Willis were acquired using MRA technique without intravenous contrast. Angiographic images of the neck were acquired using MRA technique without and with intravenous contrast. Carotid stenosis measurements (when applicable) are obtained utilizing NASCET criteria, using the distal internal carotid diameter as the denominator. CONTRAST:  61mL GADAVIST GADOBUTROL 1 MMOL/ML IV SOLN COMPARISON:  2013 FINDINGS: MRI HEAD Brain: There is no acute infarction or intracranial hemorrhage. There is no intracranial mass, mass effect, or edema. There is no hydrocephalus or extra-axial fluid collection. Prominence of the ventricles and sulci reflects generalized parenchymal volume loss. Small chronic right thalamic and left 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  parietooccipital infarcts. Additional patchy foci of T2 hyperintensity in the supratentorial white matter are nonspecific but may reflect mild chronic microvascular ischemic changes. Punctate focus of susceptibility in the right parietal subcortical white matter is most compatible with a chronic microhemorrhage. Vascular: Major vessel flow voids at the skull base are preserved. Skull and upper cervical spine: Normal marrow signal is preserved. Sinuses/Orbits: Paranasal sinuses are aerated. Orbits are unremarkable. Other: Sella is unremarkable.  Mastoid air cells are clear. MRA HEAD Intracranial internal carotid arteries are patent. There is a 2 mm superiorly directed outpouching from the distal supraclinoid right ICA (series 3, images 103). And anterior cerebral arteries are patent. Intracranial vertebral arteries, basilar artery, posterior cerebral arteries are patent. Focal mild stenosis of the right P2 PCA. There is no significant stenosis. MRA NECK Common carotid arteries are patent, noting that proximal portions are not as well evaluated due to artifact. Internal and external carotid arteries are patent. Atherosclerosis is present at the ICA origins without hemodynamically significant stenosis. Stenosis measurement is suboptimal due to artifact. Partially retropharyngeal course. Extracranial vertebral arteries are patent, noting that the proximal V1 segments are not as well evaluated due to artifact. IMPRESSION: No acute infarction, hemorrhage, or mass. 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 described. Suboptimal vascular imaging of the neck due to motion artifact. No hemodynamically significant stenosis. Mild right P2 PCA stenosis.  No high-grade intracranial stenosis. 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 Electronically Signed   By: Macy Mis M.D.   On: 01/27/2021 15:28   MR ANGIO NECK W WO CONTRAST  Result  Date: 01/27/2021 CLINICAL DATA:  TIA EXAM: MRI HEAD WITHOUT CONTRAST MRA HEAD WITHOUT CONTRAST MRA NECK WITHOUT AND WITH CONTRAST TECHNIQUE: Multiplanar, multi-echo pulse sequences of the brain and surrounding structures were acquired without intravenous contrast. Angiographic images of the Circle of Willis were acquired using MRA technique without intravenous contrast. Angiographic images of the neck were acquired using MRA technique without and with intravenous contrast. Carotid stenosis measurements (when applicable) are obtained utilizing NASCET criteria, using the distal internal carotid diameter as the denominator. CONTRAST:  76mL GADAVIST GADOBUTROL 1 MMOL/ML IV SOLN COMPARISON:  2013 FINDINGS: MRI HEAD Brain: There is no acute infarction or intracranial hemorrhage. There is no intracranial mass, mass effect, or edema. There is no hydrocephalus or extra-axial fluid collection. Prominence of the ventricles and sulci reflects generalized parenchymal volume loss. Small chronic right thalamic and left 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 parietooccipital infarcts. Additional patchy foci  of T2 hyperintensity in the supratentorial white matter are nonspecific but may reflect mild chronic microvascular ischemic changes. Punctate focus of susceptibility in the right parietal subcortical white matter is most compatible with a chronic microhemorrhage. Vascular: Major vessel flow voids at the skull base are preserved. Skull and upper cervical spine: Normal marrow signal is preserved. Sinuses/Orbits: Paranasal sinuses are aerated. Orbits are unremarkable. Other: Sella is unremarkable.  Mastoid air cells are clear. MRA HEAD Intracranial internal carotid arteries are patent. There is a 2 mm superiorly directed outpouching from the distal supraclinoid right ICA (series 3, images 103). And anterior cerebral arteries are patent. Intracranial vertebral arteries, basilar artery, posterior cerebral arteries are  patent. Focal mild stenosis of the right P2 PCA. There is no significant stenosis. MRA NECK Common carotid arteries are patent, noting that proximal portions are not as well evaluated due to artifact. Internal and external carotid arteries are patent. Atherosclerosis is present at the ICA origins without hemodynamically significant stenosis. Stenosis measurement is suboptimal due to artifact. Partially retropharyngeal course. Extracranial vertebral arteries are patent, noting that the proximal V1 segments are not as well evaluated due to artifact. IMPRESSION: No acute infarction, hemorrhage, or mass. 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 described. Suboptimal vascular imaging of the neck due to motion artifact. No hemodynamically significant stenosis. Mild right P2 PCA stenosis.  No high-grade intracranial stenosis. 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 Electronically Signed   By: Macy Mis M.D.   On: 01/27/2021 15:28   MR BRAIN WO CONTRAST  Result Date: 01/27/2021 CLINICAL DATA:  TIA EXAM: MRI HEAD WITHOUT CONTRAST MRA HEAD WITHOUT CONTRAST MRA NECK WITHOUT AND WITH CONTRAST TECHNIQUE: Multiplanar, multi-echo pulse sequences of the brain and surrounding structures were acquired without intravenous contrast. Angiographic images of the Circle of Willis were acquired using MRA technique without intravenous contrast. Angiographic images of the neck were acquired using MRA technique without and with intravenous contrast. Carotid stenosis measurements (when applicable) are obtained utilizing NASCET criteria, using the distal internal carotid diameter as the denominator. CONTRAST:  68mL GADAVIST GADOBUTROL 1 MMOL/ML IV SOLN COMPARISON:  2013 FINDINGS: MRI HEAD Brain: There is no acute infarction or intracranial hemorrhage. There is no intracranial mass, mass effect, or edema. There is no hydrocephalus or extra-axial  fluid collection. Prominence of the ventricles and sulci reflects generalized parenchymal volume loss. Small chronic right thalamic and left 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 parietooccipital infarcts. Additional patchy foci of T2 hyperintensity in the supratentorial white matter are nonspecific but may reflect mild chronic microvascular ischemic changes. Punctate focus of susceptibility in the right parietal subcortical white matter is most compatible with a chronic microhemorrhage. Vascular: Major vessel flow voids at the skull base are preserved. Skull and upper cervical spine: Normal marrow signal is preserved. Sinuses/Orbits: Paranasal sinuses are aerated. Orbits are unremarkable. Other: Sella is unremarkable.  Mastoid air cells are clear. MRA HEAD Intracranial internal carotid arteries are patent. There is a 2 mm superiorly directed outpouching from the distal supraclinoid right ICA (series 3, images 103). And anterior cerebral arteries are patent. Intracranial vertebral arteries, basilar artery, posterior cerebral arteries are patent. Focal mild stenosis of the right P2 PCA. There is no significant stenosis. MRA NECK Common carotid arteries are patent, noting that proximal portions are not as well evaluated due to artifact. Internal and external carotid arteries are patent. Atherosclerosis is present at the ICA origins without hemodynamically significant stenosis. Stenosis measurement is  suboptimal due to artifact. Partially retropharyngeal course. Extracranial vertebral arteries are patent, noting that the proximal V1 segments are not as well evaluated due to artifact. IMPRESSION: No acute infarction, hemorrhage, or mass. 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 described. Suboptimal vascular imaging of the neck due to motion artifact. No hemodynamically significant stenosis. Mild right P2 PCA stenosis.  No high-grade intracranial stenosis. Small superiorly  directed outpouching of the distal supraclinoid right ICA likely reflecting an aneurysm. Electronically Signed   By: Macy Mis M.D.   On: 01/27/2021 15:28   EEG adult  Result Date: 01/28/2021 Lora Havens, MD     01/28/2021  1:05 PM Patient Name: William [name] MRN: [id number] Epilepsy Attending: Lora Havens Referring Physician/Provider: Dr Rosalin Hawking Date: 01/28/2021 Duration: 21.28 mins Patient history: 75 year old male with transient episode of expressive aphasia.  EEG to evaluate for seizures. Level of alertness: Awake, drowsy AEDs during EEG study: None Technical aspects: This EEG study was done with scalp electrodes positioned according to the 10-20 International system of electrode placement. Electrical activity was acquired at a sampling rate of 500Hz  and reviewed with a high frequency filter of 70Hz  and a low frequency filter of 1Hz . EEG data were recorded continuously and digitally stored. Description: The posterior dominant rhythm consists of 9Hz  activity of moderate voltage (25-35 uV) seen predominantly in posterior head regions, symmetric and reactive to eye opening and eye closing. Drowsiness was characterized by attenuation of the posterior background rhythm.  Hyperventilation and photic stimulation were not performed.   IMPRESSION: This study is within normal limits. No seizures or epileptiform discharges were seen throughout the recording. Lora Havens     Signed: Mitzie Na, MD 01/28/2021, 1:47 PM   Pager: [phone]

## 2021-01-28 NOTE — Procedures (Signed)
Patient Name: William [name]  MRN: [id number]  Epilepsy Attending: Lora Havens  Referring Physician/Provider: Dr Rosalin Hawking Date: 01/28/2021  Duration: 21.28 mins  Patient history: 75 year old male with transient episode of expressive aphasia.  EEG to evaluate for seizures.  Level of alertness: Awake, drowsy  AEDs during EEG study: None  Technical aspects: This EEG study was done with scalp electrodes positioned according to the 10-20 International system of electrode placement. Electrical activity was acquired at a sampling rate of 500Hz  and reviewed with a high frequency filter of 70Hz  and a low frequency filter of 1Hz . EEG data were recorded continuously and digitally stored.   Description: The posterior dominant rhythm consists of 9Hz  activity of moderate voltage (25-35 uV) seen predominantly in posterior head regions, symmetric and reactive to eye opening and eye closing. Drowsiness was characterized by attenuation of the posterior background rhythm.  Hyperventilation and photic stimulation were not performed.     IMPRESSION: This study is within normal limits. No seizures or epileptiform discharges were seen throughout the recording.  Gracey Tolle Barbra Sarks

## 2021-02-12 DIAGNOSIS — Z8673 Personal history of transient ischemic attack (TIA), and cerebral infarction without residual deficits: Secondary | ICD-10-CM | POA: Diagnosis not present

## 2021-02-12 DIAGNOSIS — Z79899 Other long term (current) drug therapy: Secondary | ICD-10-CM | POA: Diagnosis not present

## 2021-02-12 DIAGNOSIS — R3914 Feeling of incomplete bladder emptying: Secondary | ICD-10-CM | POA: Diagnosis not present

## 2021-02-12 DIAGNOSIS — Z6838 Body mass index (BMI) 38.0-38.9, adult: Secondary | ICD-10-CM | POA: Diagnosis not present

## 2021-02-12 DIAGNOSIS — E785 Hyperlipidemia, unspecified: Secondary | ICD-10-CM | POA: Diagnosis not present

## 2021-02-12 DIAGNOSIS — E119 Type 2 diabetes mellitus without complications: Secondary | ICD-10-CM | POA: Diagnosis not present

## 2021-02-12 DIAGNOSIS — G459 Transient cerebral ischemic attack, unspecified: Secondary | ICD-10-CM | POA: Diagnosis not present

## 2021-02-12 DIAGNOSIS — R972 Elevated prostate specific antigen [PSA]: Secondary | ICD-10-CM | POA: Diagnosis not present

## 2021-02-12 DIAGNOSIS — I10 Essential (primary) hypertension: Secondary | ICD-10-CM | POA: Diagnosis not present

## 2021-02-12 DIAGNOSIS — N401 Enlarged prostate with lower urinary tract symptoms: Secondary | ICD-10-CM | POA: Diagnosis not present

## 2021-02-12 DIAGNOSIS — M109 Gout, unspecified: Secondary | ICD-10-CM | POA: Diagnosis not present

## 2021-02-23 DIAGNOSIS — E118 Type 2 diabetes mellitus with unspecified complications: Secondary | ICD-10-CM | POA: Diagnosis not present

## 2021-02-23 DIAGNOSIS — C189 Malignant neoplasm of colon, unspecified: Secondary | ICD-10-CM | POA: Diagnosis not present

## 2021-02-23 DIAGNOSIS — I10 Essential (primary) hypertension: Secondary | ICD-10-CM | POA: Diagnosis not present

## 2021-02-23 DIAGNOSIS — G459 Transient cerebral ischemic attack, unspecified: Secondary | ICD-10-CM | POA: Diagnosis not present

## 2021-02-23 DIAGNOSIS — D72829 Elevated white blood cell count, unspecified: Secondary | ICD-10-CM | POA: Diagnosis not present

## 2021-02-23 DIAGNOSIS — Z6838 Body mass index (BMI) 38.0-38.9, adult: Secondary | ICD-10-CM | POA: Diagnosis not present

## 2021-03-11 ENCOUNTER — Encounter: Payer: Self-pay | Admitting: Adult Health

## 2021-03-11 ENCOUNTER — Ambulatory Visit (INDEPENDENT_AMBULATORY_CARE_PROVIDER_SITE_OTHER): Payer: Medicare Other | Admitting: Adult Health

## 2021-03-11 ENCOUNTER — Other Ambulatory Visit: Payer: Self-pay

## 2021-03-11 VITALS — BP 122/70 | HR 77 | Ht 62.0 in | Wt 210.0 lb

## 2021-03-11 DIAGNOSIS — B370 Candidal stomatitis: Secondary | ICD-10-CM | POA: Insufficient documentation

## 2021-03-11 DIAGNOSIS — R7989 Other specified abnormal findings of blood chemistry: Secondary | ICD-10-CM | POA: Insufficient documentation

## 2021-03-11 DIAGNOSIS — D500 Iron deficiency anemia secondary to blood loss (chronic): Secondary | ICD-10-CM | POA: Insufficient documentation

## 2021-03-11 DIAGNOSIS — Z8673 Personal history of transient ischemic attack (TIA), and cerebral infarction without residual deficits: Secondary | ICD-10-CM | POA: Insufficient documentation

## 2021-03-11 DIAGNOSIS — M109 Gout, unspecified: Secondary | ICD-10-CM | POA: Insufficient documentation

## 2021-03-11 DIAGNOSIS — R739 Hyperglycemia, unspecified: Secondary | ICD-10-CM | POA: Insufficient documentation

## 2021-03-11 DIAGNOSIS — E119 Type 2 diabetes mellitus without complications: Secondary | ICD-10-CM | POA: Insufficient documentation

## 2021-03-11 DIAGNOSIS — D75839 Thrombocytosis, unspecified: Secondary | ICD-10-CM | POA: Insufficient documentation

## 2021-03-11 DIAGNOSIS — R161 Splenomegaly, not elsewhere classified: Secondary | ICD-10-CM | POA: Insufficient documentation

## 2021-03-11 DIAGNOSIS — I77810 Thoracic aortic ectasia: Secondary | ICD-10-CM | POA: Insufficient documentation

## 2021-03-11 DIAGNOSIS — M1990 Unspecified osteoarthritis, unspecified site: Secondary | ICD-10-CM | POA: Insufficient documentation

## 2021-03-11 DIAGNOSIS — N400 Enlarged prostate without lower urinary tract symptoms: Secondary | ICD-10-CM | POA: Insufficient documentation

## 2021-03-11 DIAGNOSIS — R0602 Shortness of breath: Secondary | ICD-10-CM | POA: Insufficient documentation

## 2021-03-11 DIAGNOSIS — R972 Elevated prostate specific antigen [PSA]: Secondary | ICD-10-CM | POA: Insufficient documentation

## 2021-03-11 DIAGNOSIS — G459 Transient cerebral ischemic attack, unspecified: Secondary | ICD-10-CM | POA: Diagnosis not present

## 2021-03-11 DIAGNOSIS — K219 Gastro-esophageal reflux disease without esophagitis: Secondary | ICD-10-CM | POA: Insufficient documentation

## 2021-03-11 DIAGNOSIS — E785 Hyperlipidemia, unspecified: Secondary | ICD-10-CM | POA: Insufficient documentation

## 2021-03-11 DIAGNOSIS — K5900 Constipation, unspecified: Secondary | ICD-10-CM | POA: Insufficient documentation

## 2021-03-11 DIAGNOSIS — Z8739 Personal history of other diseases of the musculoskeletal system and connective tissue: Secondary | ICD-10-CM | POA: Insufficient documentation

## 2021-03-11 DIAGNOSIS — N529 Male erectile dysfunction, unspecified: Secondary | ICD-10-CM | POA: Insufficient documentation

## 2021-03-11 NOTE — Progress Notes (Signed)
I agree with the above plan 

## 2021-03-11 NOTE — Progress Notes (Signed)
[name] [address] [place]. [place] [address] [phone]       HOSPITAL FOLLOW UP NOTE  [name] Date of Birth:  Mar 21, 1946 Medical Record Number:  [id number]   Reason for Referral:  hospital stroke follow up    SUBJECTIVE:   CHIEF COMPLAINT:  Chief Complaint  Patient presents with   Follow-up    Rm 3 alone, Here for hsp f/u reports he has been doing well. No concerns.     HPI:   [name] is a 75 y.o. male with history of HTN, HLD, CKD, colon Ca s/p surgery and chemo, stroke in 2013 and ? OSA admitted on 01/27/2021 for episode of expressive aphasia.  Personally reviewed hospitalization pertinent progress notes, lab work and imaging.  Evaluated by Dr. Erlinda Hong for left brain TIA concerning for cardioembolic source.  MRI and MRA unremarkable.  EEG within normal limits.  EF 55 to 60%.  Recommend cardiac event monitor outpatient to rule out A. fib.  LDL 141.  A1c 7.4. no prior hx of DM.  On aspirin 325 mg PTA and recommended aspirin and Plavix for 3 weeks and Plavix and recommended changing home pravastatin to atorvastatin 40 mg daily.  Other stroke risk factors include advanced age, obesity, hx of stroke/TIA in 2013 and OSA.  PT/OT/SLP eval with no therapy needs and discharged home.  Today, 03/11/2021, [name] is being seen for hospital follow-up unaccompanied.  Overall doing well.  Denies new or reoccurring stroke/TIA symptoms.  Remains on both aspirin and Plavix as well as atorvastatin without side effects.  Blood pressure today 122/70 - stable at home. Glucose levels 100-120s on metformin.  Dietary changes with 10 pound weight loss since discharge.  Followed by [facility] for sleep apnea currently awaiting CPAP machine.  Has had follow-up with PCP since discharge and revisit on 9/13.  No further concerns at this time.     Pertinent imaging  CT neg for acute finding MRI, MRA head/neck  IMPRESSION:No acute  infarction, hemorrhage, or mass. 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 described. Suboptimal vascular imaging of the neck due to motion artifact. No hemodynamically significant stenosis. Mild right P2 PCA stenosis.  No high-grade intracranial stenosis. 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   Arthritis    Atherogenic dyslipidemia    Benign prostatic hyperplasia 01/27/2021   Constipation    Diabetes ([place])    Ectatic thoracic aorta (HCC)    Elevated blood sugar    Elevated PSA    Enlarged prostate    Erectile dysfunction    Expected blood loss anemia 08/15/2013   GERD (gastroesophageal reflux disease)    Gout    History of gout    Hyperlipidemia    Hypertension    Iron deficiency anemia due to chronic blood loss    Low testosterone    Malignant neoplasm of colon ([place]) 10/01/2020   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   Obese 08/15/2013   S/P right THA, 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   Shortness of breath    WITH EXERTION   Spleen enlarged    Stage 3a chronic kidney disease ([place]) 06/26/2020  Status post CVA    Stroke ([place]) 07/11/2012   RESIDUAL - SLIGHT NUMBNESS L HAND L SIDE OF FACE   Thrombocytosis    Thrush    TIA (transient ischemic attack) 01/27/2021    PSH:  Past Surgical History:  Procedure Laterality Date   COLOSTOMY  2011   for diverticulitis   COLOSTOMY REVERSAL  2012   inghernia     INGUINAL HERNIA REPAIR     TOTAL HIP ARTHROPLASTY Right 08/14/2013   Procedure: RIGHT TOTAL HIP ARTHROPLASTY ANTERIOR APPROACH;  Surgeon: Mauri Pole, MD;  Location: [facility];  Service: Orthopedics;  Laterality: Right;    Social History:  Social History    Socioeconomic History   Marital status: Married    Spouse name: Not on file   Number of children: Not on file   Years of education: Not on file   Highest education level: Not on file  Occupational History   Not on file  Tobacco Use   Smoking status: Never   Smokeless tobacco: Never  Substance and Sexual Activity   Alcohol use: No   Drug use: No   Sexual activity: Not on file  Other Topics Concern   Not on file  Social History Narrative   Not on file   Social Determinants of Health   Financial Resource Strain: Not on file  Food Insecurity: Not on file  Transportation Needs: Not on file  Physical Activity: Not on file  Stress: Not on file  Social Connections: Not on file  Intimate Partner Violence: Not on file    Family History:  Family History  Problem Relation Age of Onset   Lymphoma Mother    Diverticulitis Mother    Heart disease Father    Stroke Father    Diverticulitis Brother     Medications:   Current Outpatient Medications on File Prior to Visit  Medication Sig Dispense Refill   atorvastatin (LIPITOR) 40 MG tablet Take 1 tablet (40 mg total) by mouth daily. 30 tablet 0   Cholecalciferol 125 MCG (5000 UT) TABS Take 1 tablet by mouth daily.     clopidogrel (PLAVIX) 75 MG tablet Take 1 tablet (75 mg total) by mouth daily. 30 tablet 0   finasteride (PROSCAR) 5 MG tablet Take 5 mg by mouth daily.     losartan (COZAAR) 100 MG tablet Take 1 tablet by mouth daily.     metFORMIN (GLUCOPHAGE) 500 MG tablet Take 1 tablet (500 mg total) by mouth 2 (two) times daily with a meal. 60 tablet 0   tamsulosin (FLOMAX) 0.4 MG CAPS capsule Take 0.4 mg by mouth daily.     zinc gluconate 50 MG tablet Take 50 mg by mouth daily.     No current facility-administered medications on file prior to visit.    Allergies:   Allergies  Allergen Reactions   Hydrochlorothiazide    Metoprolol Succinate [Metoprolol]     constipation      OBJECTIVE:  Physical Exam  Vitals:    03/11/21 1508  BP: 122/70  Pulse: 77  SpO2: 96%  Weight: 210 lb (95.3 kg)  Height: '5\' 2"'$  (1.575 m)   Body mass index is 38.41 kg/m. No results found.   Post stroke PHQ 2/9 Depression screen PHQ 2/9 03/11/2021  Decreased Interest 0  Down, Depressed, Hopeless 0  PHQ - 2 Score 0     General: well developed, well nourished, pleasant elderly Caucasian male, seated, in no evident distress Head: head normocephalic and atraumatic.  Neck: supple with no carotid or supraclavicular bruits Cardiovascular: regular rate and rhythm, no murmurs Musculoskeletal: no deformity Skin:  no rash/petichiae Vascular:  Normal pulses all extremities   Neurologic Exam Mental Status: Awake and fully alert.  Fluent speech and language.  Oriented to place and time. Recent and remote memory intact. Attention span, concentration and fund of knowledge appropriate. Mood and affect appropriate.  Cranial Nerves: Fundoscopic exam reveals sharp disc margins. Pupils equal, briskly reactive to light. Extraocular movements full without nystagmus. Visual fields full to confrontation. Hearing intact. Facial sensation intact. Face, tongue, palate moves normally and symmetrically.  Motor: Normal bulk and tone. Normal strength in all tested extremity muscles Sensory.: intact to touch , pinprick , position and vibratory sensation.  Coordination: Rapid alternating movements normal in all extremities. Finger-to-nose and heel-to-shin performed accurately bilaterally. Gait and Station: Arises from chair without difficulty. Stance is normal. Gait demonstrates normal stride length and balance without use of assistive device.  Reflexes: 1+ and symmetric. Toes downgoing.     NIHSS  0 Modified Rankin  0      ASSESSMENT: William [name] is a 75 y.o. year old male with recent left brain TIA concerning for cardioembolic source on [id number] after presenting with expressive aphasia.  Vascular risk factors include HTN,  HLD, new dx of DM, hx of stroke 2013 with chronic R frontal and b/l parieto-occipital strokes on imaging, and OSA.      PLAN:  TIA:  Continue clopidogrel 75 mg daily  and atorvastatin 40 mg daily for secondary stroke prevention.  Advised to discontinue aspirin as 3 weeks DAPT completed.   Cards f/u 9/12 to discuss cardiac monitor to evaluate for possible atrial fibrillation as etiology.  Discussed secondary stroke prevention measures and importance of close PCP follow up for aggressive stroke risk factor management. I have gone over the pathophysiology of stroke, warning signs and symptoms, risk factors and their management in some detail with instructions to go to the closest emergency room for symptoms of concern. HTN: BP goal <130/90.  Stable on current regimen per PCP HLD: LDL goal <70. Recent LDL 141.  Continue atorvastatin 40 mg daily New dx DMII: A1c goal<7.0. Recent A1c 7.4. started on Metformin.  Monitored by PCP OSA: Diagnosed by [facility] currently awaiting CPAP machine    Follow up in 6 months or call earlier if needed   CC:  [place] provider: Dr. Leonie Man PCP: Penelope Coop, FNP    I spent 52 minutes of face-to-face and non-face-to-face time with patient.  This included previsit chart review including review of recent hospitalization, lab review, study review, electronic health record documentation, patient education regarding recent TIA and possible etiology, secondary stroke prevention measures and importance of managing stroke risk factors and answered all questions to patient satisfaction   Frann Rider, AGNP-BC  [facility] [address] [place], [place] [address]  Phone [phone] Fax [phone] Note: This document was prepared with digital dictation and possible smart phrase technology. Any transcriptional errors that result from this process are unintentional.

## 2021-03-11 NOTE — Patient Instructions (Signed)
Continue clopidogrel 75 mg daily  and atorvastatin '40mg'$  daily  for secondary stroke prevention  Please stop aspirin now as this is no longer needed  Continue to follow up with PCP regarding cholesterol, diabetes and blood pressure management  Maintain strict control of hypertension with blood pressure goal below 130/90, diabetes with hemoglobin A1c goal below 7% and cholesterol with LDL cholesterol (bad cholesterol) goal below 70 mg/dL.   Follow up with cardiology as scheduled and discuss use of cardiac monitor which will further evaluate for atrial fibrillation as potential stroke etiology     Followup in the future with me in 6 months or call earlier if needed       Thank you for coming to see [place] at [facility]. I hope we have been able to provide you high quality care today.  You may receive a patient satisfaction survey over the next few weeks. We would appreciate your feedback and comments so that we may continue to improve ourselves and the health of our patients.

## 2021-03-20 NOTE — Progress Notes (Signed)
[facility]  [address] [place],  [place]  [address] [phone]  Clinic Day:  03/30/2021  This document serves as a record of services personally performed by Marice Potter, MD. It was created on their behalf by Curry,Lauren E, a trained medical scribe. The creation of this record is based on the scribe's personal observations and the provider's statements to them.  HISTORY OF PRESENT ILLNESS:  The patient is a 75 y.o. male with stage IIIB (T3 N1b M0) colon cancer, status post a right hemicolectomy in February 2019.  This was followed by 4 cycles of adjuvant XELOX chemotherapy, which were completed in May 2019.  He comes in today for routine followup.  Since his last visit, the patient has been doing okay.  Of note, the patient had a TIA.  Although he is back to his baseline, he is on medication to prevent another cerebrovascular insult from developing.   He denies having abdominal pain, rectal bleeding, or other GI symptoms which concern him for disease recurrence.  PHYSICAL EXAM:  Blood pressure 120/75, pulse 93, temperature 98.4 F (36.9 C), temperature source Oral, resp. rate (!) 98, height '5\' 2"'$  (1.575 m), weight 208 lb 3.2 oz (94.4 kg), SpO2 95 %. Wt Readings from Last 3 Encounters:  03/30/21 208 lb 3.2 oz (94.4 kg)  03/30/21 208 lb 12.8 oz (94.7 kg)  03/11/21 210 lb (95.3 kg)   Body mass index is 38.08 kg/m. Performance status (ECOG): 0 - Asymptomatic Physical Exam Constitutional:      Appearance: Normal appearance. He is not ill-appearing.  HENT:     Mouth/Throat:     Mouth: Mucous membranes are moist.     Pharynx: Oropharynx is clear. No oropharyngeal exudate or posterior oropharyngeal erythema.  Cardiovascular:     Rate and Rhythm: Normal rate and regular rhythm.     Heart sounds: No murmur heard.   No friction rub. No gallop.  Pulmonary:     Effort: Pulmonary effort is normal. No respiratory distress.     Breath sounds: Normal  breath sounds. No wheezing, rhonchi or rales.  Abdominal:     General: Bowel sounds are normal. There is no distension.     Palpations: Abdomen is soft. There is no mass.     Tenderness: There is no abdominal tenderness.  Musculoskeletal:        General: No swelling.     Right lower leg: No edema.     Left lower leg: No edema.  Lymphadenopathy:     Cervical: No cervical adenopathy.     Upper Body:     Right upper body: No supraclavicular or axillary adenopathy.     Left upper body: No supraclavicular or axillary adenopathy.     Lower Body: No right inguinal adenopathy. No left inguinal adenopathy.  Skin:    General: Skin is warm.     Coloration: Skin is not jaundiced.     Findings: No lesion or rash.  Neurological:     General: No focal deficit present.     Mental Status: He is alert and oriented to person, place, and time. Mental status is at baseline.     Cranial Nerves: Cranial nerves are intact.  Psychiatric:        Mood and Affect: Mood normal.        Behavior: Behavior normal.        Thought Content: Thought content normal.   LABS:    Ref. Range 03/30/2021 15:40  CEA Latest Ref Range: 0.0 - 4.7 ng/mL 1.3     ASSESSMENT & PLAN:  Assessment/Plan:  A 75 y.o. male with stage IIIB (T3 N1b M0) colon cancer, status post a right hemicolectomy in February 2019, followed by 4 cycles of adjuvant XELOX chemotherapy, completed in May 2019.  Based upon his physical exam and CEA level, the patient remains disease-free.  Clinically, he is doing well.  I will see him back in 6 months for repeat clinical assessment.  The patient understands all the plans discussed today and is in agreement with them.     I, Rita Ohara, am acting as scribe for Marice Potter, MD    I have reviewed this report as typed by the medical scribe, and it is complete and accurate.  Dequincy Macarthur Critchley, MD

## 2021-03-30 ENCOUNTER — Inpatient Hospital Stay: Payer: Medicare Other | Attending: Oncology

## 2021-03-30 ENCOUNTER — Inpatient Hospital Stay (INDEPENDENT_AMBULATORY_CARE_PROVIDER_SITE_OTHER): Payer: Medicare Other | Admitting: Oncology

## 2021-03-30 ENCOUNTER — Encounter: Payer: Self-pay | Admitting: Cardiology

## 2021-03-30 ENCOUNTER — Other Ambulatory Visit: Payer: Self-pay

## 2021-03-30 ENCOUNTER — Encounter: Payer: Self-pay | Admitting: Oncology

## 2021-03-30 ENCOUNTER — Telehealth: Payer: Self-pay | Admitting: Oncology

## 2021-03-30 ENCOUNTER — Ambulatory Visit (INDEPENDENT_AMBULATORY_CARE_PROVIDER_SITE_OTHER): Payer: Medicare Other | Admitting: Cardiology

## 2021-03-30 VITALS — BP 120/75 | HR 93 | Temp 98.4°F | Resp 98 | Ht 62.0 in | Wt 208.2 lb

## 2021-03-30 VITALS — BP 124/80 | HR 78 | Ht 62.0 in | Wt 208.8 lb

## 2021-03-30 DIAGNOSIS — D500 Iron deficiency anemia secondary to blood loss (chronic): Secondary | ICD-10-CM | POA: Diagnosis not present

## 2021-03-30 DIAGNOSIS — R0602 Shortness of breath: Secondary | ICD-10-CM | POA: Diagnosis not present

## 2021-03-30 DIAGNOSIS — N1831 Chronic kidney disease, stage 3a: Secondary | ICD-10-CM | POA: Diagnosis not present

## 2021-03-30 DIAGNOSIS — C184 Malignant neoplasm of transverse colon: Secondary | ICD-10-CM | POA: Diagnosis not present

## 2021-03-30 DIAGNOSIS — I10 Essential (primary) hypertension: Secondary | ICD-10-CM | POA: Diagnosis not present

## 2021-03-30 DIAGNOSIS — E785 Hyperlipidemia, unspecified: Secondary | ICD-10-CM | POA: Diagnosis not present

## 2021-03-30 DIAGNOSIS — C189 Malignant neoplasm of colon, unspecified: Secondary | ICD-10-CM | POA: Insufficient documentation

## 2021-03-30 DIAGNOSIS — G459 Transient cerebral ischemic attack, unspecified: Secondary | ICD-10-CM

## 2021-03-30 DIAGNOSIS — R011 Cardiac murmur, unspecified: Secondary | ICD-10-CM | POA: Diagnosis not present

## 2021-03-30 HISTORY — DX: Cardiac murmur, unspecified: R01.1

## 2021-03-30 NOTE — Progress Notes (Signed)
Cardiology Office Note:    Date:  03/30/2021   ID:  William [name], DOB Nov 16, 1945, MRN [id number]  PCP:  Penelope Coop, FNP  Cardiologist:  Jenean Lindau, MD   Referring MD: Penelope Coop, FNP    ASSESSMENT:    1. Murmur, cardiac   2. Shortness of breath   3. TIA (transient ischemic attack)   4. Primary hypertension   5. Atherogenic dyslipidemia   6. Stage 3a chronic kidney disease (HCC)   7. Cardiac murmur    PLAN:    In order of problems listed above:  Primary prevention stressed with the patient.  Importance of compliance with diet medication stressed any vocalized understanding. Dyspnea on exertion: Patient has multiple risk factors for coronary artery disease.  In view of this I would like to get him evaluated for the possibility of coronary artery disease which may be obstructive in nature and recommended Lexiscan sestamibi and he is agreeable. Essential hypertension: Blood pressure stable and diet was emphasized.  Lifestyle modification urged. Obesity and mixed dyslipidemia: Atherosclerotic vascular disease: I discussed this with him at length and he promises to do better with diet and some exercise. Cardiac murmur: Echocardiogram will be done to assess murmur heard on auscultation. Patient will be seen in follow-up appointment in 6 months or earlier if the patient has any concerns   Medication Adjustments/Labs and Tests Ordered: Current medicines are reviewed at length with the patient today.  Concerns regarding medicines are outlined above.  Orders Placed This Encounter  Procedures   MYOCARDIAL PERFUSION IMAGING   EKG 12-Lead   No orders of the defined types were placed in this encounter.    History of Present Illness:    William [name] is a 75 y.o. male who is being seen today for the evaluation of cardiac murmur and dyspnea on exertion at the request of Penelope Coop, FNP.  Patient has past medical history of essential hypertension,  mixed dyslipidemia and renal insufficiency.  He leads a sedentary lifestyle.  He has dyspnea on exertion.  He leads a sedentary lifestyle.  He is significantly obese.  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   Obese 08/15/2013   S/P right THA, 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   Shortness of breath    WITH EXERTION   Spleen enlarged    Stage 3a chronic kidney disease ([place]) 06/26/2020   Status post CVA    Stroke ([place]) 07/11/2012   RESIDUAL - SLIGHT NUMBNESS L HAND L SIDE OF FACE   Thrombocytosis    Thrush    TIA (transient ischemic attack) 01/27/2021    Past Surgical History:  Procedure Laterality Date   COLOSTOMY  2011   for diverticulitis   COLOSTOMY REVERSAL  2012   inghernia  INGUINAL HERNIA REPAIR     TOTAL HIP ARTHROPLASTY Right 08/14/2013   Procedure: RIGHT TOTAL HIP ARTHROPLASTY ANTERIOR APPROACH;  Surgeon: Mauri Pole, MD;  Location: [facility];  Service: Orthopedics;  Laterality: Right;    Current Medications: Current Meds  Medication Sig   allopurinol (ZYLOPRIM) 300 MG tablet Take 1 tablet by mouth daily.   atorvastatin (LIPITOR) 40 MG tablet Take 1 tablet (40 mg total) by mouth daily.   Cholecalciferol 125 MCG (5000 UT) TABS Take 1 tablet by mouth daily.   clopidogrel (PLAVIX) 75 MG tablet Take 1 tablet (75 mg total) by mouth daily.   finasteride (PROSCAR) 5 MG tablet Take 5 mg by mouth daily.    losartan (COZAAR) 100 MG tablet Take 50 mg by mouth daily.   metFORMIN (GLUCOPHAGE) 500 MG tablet Take 1 tablet (500 mg total) by mouth 2 (two) times daily with a meal.   tamsulosin (FLOMAX) 0.4 MG CAPS capsule Take 0.4 mg by mouth daily.   zinc gluconate 50 MG tablet Take 50 mg by mouth daily.     Allergies:   Hydrochlorothiazide and Metoprolol succinate [metoprolol]   Social History   Socioeconomic History   Marital status: Married    Spouse name: Not on file   Number of children: Not on file   Years of education: Not on file   Highest education level: Not on file  Occupational History   Not on file  Tobacco Use   Smoking status: Never   Smokeless tobacco: Never  Substance and Sexual Activity   Alcohol use: No   Drug use: No   Sexual activity: Not on file  Other Topics Concern   Not on file  Social History Narrative   Not on file   Social Determinants of Health   Financial Resource Strain: Not on file  Food Insecurity: Not on file  Transportation Needs: Not on file  Physical Activity: Not on file  Stress: Not on file  Social Connections: Not on file     Family History: The patient's family history includes Diverticulitis in his brother and mother; Heart disease in his father; Lymphoma in his mother; Stroke in his father.  ROS:   Please see the history of present illness.    All other systems reviewed and are negative.  EKGs/Labs/Other Studies Reviewed:    The following studies were reviewed today: EKG reveals sinus rhythm and nonspecific ST-T changes.   Recent Labs: 01/27/2021: Hemoglobin 15.5; Magnesium 2.0; Platelets 505 01/28/2021: BUN 21; Creatinine, Ser 1.44; Potassium 4.6; Sodium 138  Recent Lipid Panel    Component Value Date/Time   CHOL 202 (H) 01/28/2021 0028   TRIG 276 (H) 01/28/2021 0028   HDL 29 (L) 01/28/2021 0028   CHOLHDL 7.0 01/28/2021 0028   VLDL 55 (H) 01/28/2021 0028   LDLCALC 118 (H) 01/28/2021 0028    Physical Exam:    VS:  BP  124/80   Pulse 78   Ht '5\' 2"'$  (1.575 m)   Wt 208 lb 12.8 oz (94.7 kg)   SpO2 94%   BMI 38.19 kg/m     Wt Readings from Last 3 Encounters:  03/30/21 208 lb 12.8 oz (94.7 kg)  03/11/21 210 lb (95.3 kg)  01/27/21 211 lb 6.7 oz (95.9 kg)     GEN: Patient is in no acute distress HEENT: Normal NECK: No JVD; No carotid bruits LYMPHATICS: No lymphadenopathy CARDIAC: S1 S2 regular, 2/6 systolic murmur at the apex. RESPIRATORY:  Clear to auscultation without rales,  wheezing or rhonchi  ABDOMEN: Soft, non-tender, non-distended MUSCULOSKELETAL:  No edema; No deformity  SKIN: Warm and dry NEUROLOGIC:  Alert and oriented x 3 PSYCHIATRIC:  Normal affect    Signed, Jenean Lindau, MD  03/30/2021 3:03 PM    [facility]

## 2021-03-30 NOTE — Telephone Encounter (Signed)
Per 9/12 LOS, patient scheduled for March 2023 Appt's.  Gave patient Appt Summary

## 2021-03-30 NOTE — Patient Instructions (Signed)
Medication Instructions:  Your physician recommends that you continue on your current medications as directed. Please refer to the Current Medication list given to you today.  *If you need a refill on your cardiac medications before your next appointment, please call your pharmacy*   Lab Work: None ordered If you have labs (blood work) drawn today and your tests are completely normal, you will receive your results only by: MyChart Message (if you have MyChart) OR A paper copy in the mail If you have any lab test that is abnormal or we need to change your treatment, we will call you to review the results.   Testing/Procedures: Your physician has requested that you have a lexiscan myoview. For further information please visit [email or web address]. Please follow instruction sheet, as given.  The test will take approximately 3 to 4 hours to complete; you may bring reading material.  If someone comes with you to your appointment, they will need to remain in the main lobby due to limited space in the testing area.   How to prepare for your Myocardial Perfusion Test: Do not eat or drink 3 hours prior to your test, except you may have water. Do not consume products containing caffeine (regular or decaffeinated) 12 hours prior to your test. (ex: coffee, chocolate, sodas, tea). Do bring a list of your current medications with you.  If not listed below, you may take your medications as normal. Do wear comfortable clothes (no dresses or overalls) and walking shoes, tennis shoes preferred (No heels or open toe shoes are allowed). Do NOT wear cologne, perfume, aftershave, or lotions (deodorant is allowed). If these instructions are not followed, your test will have to be rescheduled.  Your physician has requested that you have an echocardiogram. Echocardiography is a painless test that uses sound waves to create images of your heart. It provides your doctor with information about the size and shape of  your heart and how well your heart's chambers and valves are working. This procedure takes approximately one hour. There are no restrictions for this procedure.   Follow-Up: At [facility], you and your health needs are our priority.  As part of our continuing mission to provide you with exceptional heart care, we have created designated Provider Care Teams.  These Care Teams include your primary Cardiologist (physician) and Advanced Practice Providers (APPs -  Physician Assistants and Nurse Practitioners) who all work together to provide you with the care you need, when you need it.  We recommend signing up for the patient portal called "MyChart".  Sign up information is provided on this After Visit Summary.  MyChart is used to connect with patients for Virtual Visits (Telemedicine).  Patients are able to view lab/test results, encounter notes, upcoming appointments, etc.  Non-urgent messages can be sent to your provider as well.   To learn more about what you can do with MyChart, go to [email or web address].    Your next appointment:   6 month(s)  The format for your next appointment:   In Person  Provider:   Rajan Revankar, MD   Other Instructions Cardiac Nuclear Scan A cardiac nuclear scan is a test that is done to check the flow of blood to your heart. It is done when you are resting and when you are exercising. The test looks for problems such as: Not enough blood reaching a portion of the heart. The heart muscle not working as it should. You may need this test if: You have heart disease. You have   had lab results that are not normal. You have had heart surgery or a balloon procedure to open up blocked arteries (angioplasty). You have chest pain. You have shortness of breath. In this test, a special dye (tracer) is put into your bloodstream. The tracer will travel to your heart. A camera will then take pictures of your heart to see how the tracer moves through your heart. This  test is usually done at a hospital and takes 2-4 hours. Tell a doctor about: Any allergies you have. All medicines you are taking, including vitamins, herbs, eye drops, creams, and over-the-counter medicines. Any problems you or family members have had with anesthetic medicines. Any blood disorders you have. Any surgeries you have had. Any medical conditions you have. Whether you are pregnant or may be pregnant. What are the risks? Generally, this is a safe test. However, problems may occur, such as: Serious chest pain and heart attack. This is only a risk if the stress portion of the test is done. Rapid heartbeat. A feeling of warmth in your chest. This feeling usually does not last long. Allergic reaction to the tracer. What happens before the test? Ask your doctor about changing or stopping your normal medicines. This is important. Follow instructions from your doctor about what you cannot eat or drink. Remove your jewelry on the day of the test. What happens during the test? An IV tube will be inserted into one of your veins. Your doctor will give you a small amount of tracer through the IV tube. You will wait for 20-40 minutes while the tracer moves through your bloodstream. Your heart will be monitored with an electrocardiogram (ECG). You will lie down on an exam table. Pictures of your heart will be taken for about 15-20 minutes. You may also have a stress test. For this test, one of these things may be done: You will be asked to exercise on a treadmill or a stationary bike. You will be given medicines that will make your heart work harder. This is done if you are unable to exercise. When blood flow to your heart has peaked, a tracer will again be given through the IV tube. After 20-40 minutes, you will get back on the exam table. More pictures will be taken of your heart. Depending on the tracer that is used, more pictures may need to be taken 3-4 hours later. Your IV tube  will be removed when the test is over. The test may vary among doctors and hospitals. What happens after the test? Ask your doctor: Whether you can return to your normal schedule, including diet, activities, and medicines. Whether you should drink more fluids. This will help to remove the tracer from your body. Drink enough fluid to keep your pee (urine) pale yellow. Ask your doctor, or the department that is doing the test: When will my results be ready? How will I get my results? Summary A cardiac nuclear scan is a test that is done to check the flow of blood to your heart. Tell your doctor whether you are pregnant or may be pregnant. Before the test, ask your doctor about changing or stopping your normal medicines. This is important. Ask your doctor whether you can return to your normal activities. You may be asked to drink more fluids. This information is not intended to replace advice given to you by your health care provider. Make sure you discuss any questions you have with your health care provider. Document Revised: 10/25/2018 Document Reviewed: 12/19/2017   Elsevier Patient Education  2021 Elsevier Inc.    Echocardiogram An echocardiogram is a test that uses sound waves (ultrasound) to produce images of the heart. Images from an echocardiogram can provide important information about: Heart size and shape. The size and thickness and movement of your heart's walls. Heart muscle function and strength. Heart valve function or if you have stenosis. Stenosis is when the heart valves are too narrow. If blood is flowing backward through the heart valves (regurgitation). A tumor or infectious growth around the heart valves. Areas of heart muscle that are not working well because of poor blood flow or injury from a heart attack. Aneurysm detection. An aneurysm is a weak or damaged part of an artery wall. The wall bulges out from the normal force of blood pumping through the body. Tell a  health care provider about: Any allergies you have. All medicines you are taking, including vitamins, herbs, eye drops, creams, and over-the-counter medicines. Any blood disorders you have. Any surgeries you have had. Any medical conditions you have. Whether you are pregnant or may be pregnant. What are the risks? Generally, this is a safe test. However, problems may occur, including an allergic reaction to dye (contrast) that may be used during the test. What happens before the test? No specific preparation is needed. You may eat and drink normally. What happens during the test? You will take off your clothes from the waist up and put on a hospital gown. Electrodes or electrocardiogram (ECG)patches may be placed on your chest. The electrodes or patches are then connected to a device that monitors your heart rate and rhythm. You will lie down on a table for an ultrasound exam. A gel will be applied to your chest to help sound waves pass through your skin. A handheld device, called a transducer, will be pressed against your chest and moved over your heart. The transducer produces sound waves that travel to your heart and bounce back (or "echo" back) to the transducer. These sound waves will be captured in real-time and changed into images of your heart that can be viewed on a video monitor. The images will be recorded on a computer and reviewed by your health care provider. You may be asked to change positions or hold your breath for a short time. This makes it easier to get different views or better views of your heart. In some cases, you may receive contrast through an IV in one of your veins. This can improve the quality of the pictures from your heart. The procedure may vary among health care providers and hospitals.    What can I expect after the test? You may return to your normal, everyday life, including diet, activities, and medicines, unless your health care provider tells you not to do  that. Follow these instructions at home: It is up to you to get the results of your test. Ask your health care provider, or the department that is doing the test, when your results will be ready. Keep all follow-up visits. This is important. Summary An echocardiogram is a test that uses sound waves (ultrasound) to produce images of the heart. Images from an echocardiogram can provide important information about the size and shape of your heart, heart muscle function, heart valve function, and other possible heart problems. You do not need to do anything to prepare before this test. You may eat and drink normally. After the echocardiogram is completed, you may return to your normal, everyday life, unless your   health care provider tells you not to do that. This information is not intended to replace advice given to you by your health care provider. Make sure you discuss any questions you have with your health care provider. Document Revised: 02/26/2020 Document Reviewed: 02/26/2020 Elsevier Patient Education  2021 Elsevier Inc.  

## 2021-03-31 ENCOUNTER — Telehealth: Payer: Self-pay

## 2021-03-31 ENCOUNTER — Telehealth (HOSPITAL_COMMUNITY): Payer: Self-pay | Admitting: *Deleted

## 2021-03-31 LAB — CEA: CEA: 1.3 ng/mL (ref 0.0–4.7)

## 2021-03-31 NOTE — Telephone Encounter (Signed)
Pt's CEA 1.3. Dr Bobby Rumpf states that is normal, and tell pt he will see him in 6 months.  Pt notified & verbalized understanding.

## 2021-03-31 NOTE — Telephone Encounter (Signed)
Patient given detailed instructions per Myocardial Perfusion Study Information Sheet for the test on 04/02/21 Patient notified to arrive 15 minutes early and that it is imperative to arrive on time for appointment to keep from having the test rescheduled.  If you need to cancel or reschedule your appointment, please call the office within 24 hours of your appointment. . Patient verbalized understanding. Kirstie Peri

## 2021-04-01 ENCOUNTER — Telehealth: Payer: Self-pay | Admitting: Cardiology

## 2021-04-01 NOTE — Telephone Encounter (Signed)
   Pt is requesting to speak with a nurse regarding his upcoming stress test. Pt seems like doesn't want to wear mask during the procedure

## 2021-04-01 NOTE — Telephone Encounter (Signed)
Advised that he will need to wear a mask but that his stress test is not exercise. Pt verbalized understanding and had no additional questions.

## 2021-04-02 ENCOUNTER — Other Ambulatory Visit: Payer: Self-pay

## 2021-04-02 ENCOUNTER — Ambulatory Visit (INDEPENDENT_AMBULATORY_CARE_PROVIDER_SITE_OTHER): Payer: Medicare Other

## 2021-04-02 DIAGNOSIS — R0602 Shortness of breath: Secondary | ICD-10-CM

## 2021-04-02 MED ORDER — TECHNETIUM TC 99M TETROFOSMIN IV KIT
31.9000 | PACK | Freq: Once | INTRAVENOUS | Status: AC | PRN
  Administered 2021-04-02: 31.9 via INTRAVENOUS

## 2021-04-02 MED ORDER — REGADENOSON 0.4 MG/5ML IV SOLN
0.4000 mg | Freq: Once | INTRAVENOUS | Status: AC
  Administered 2021-04-02: 0.4 mg via INTRAVENOUS

## 2021-04-02 MED ORDER — TECHNETIUM TC 99M TETROFOSMIN IV KIT
10.2000 | PACK | Freq: Once | INTRAVENOUS | Status: AC | PRN
  Administered 2021-04-02: 10.2 via INTRAVENOUS

## 2021-04-03 ENCOUNTER — Telehealth: Payer: Self-pay

## 2021-04-03 LAB — MYOCARDIAL PERFUSION IMAGING
LV dias vol: 69 mL (ref 62–150)
LV sys vol: 24 mL
Nuc Stress EF: 65 %
Peak HR: 110 {beats}/min
Rest HR: 67 {beats}/min
Rest Nuclear Isotope Dose: 10.2 mCi
SDS: 0
SRS: 1
SSS: 1
Stress Nuclear Isotope Dose: 31.9 mCi
TID: 0.96

## 2021-04-03 NOTE — Telephone Encounter (Signed)
-----   Message from Jenean Lindau, MD sent at 04/03/2021 12:53 PM EDT ----- The results of the study is unremarkable. Please inform patient. I will discuss in detail at next appointment. Cc  primary care/referring physician Jenean Lindau, MD 04/03/2021 12:53 PM

## 2021-04-03 NOTE — Telephone Encounter (Signed)
Tried calling patient. No answer and no voicemail set up for me to leave a message. 

## 2021-04-03 NOTE — Telephone Encounter (Signed)
Spoke with patient regarding results and recommendation.  Patient verbalizes understanding and is agreeable to plan of care. Advised patient to call back with any issues or concerns.  

## 2021-04-03 NOTE — Telephone Encounter (Signed)
Pt is returning call.  

## 2021-05-12 DIAGNOSIS — I129 Hypertensive chronic kidney disease with stage 1 through stage 4 chronic kidney disease, or unspecified chronic kidney disease: Secondary | ICD-10-CM | POA: Diagnosis not present

## 2021-05-12 DIAGNOSIS — E1169 Type 2 diabetes mellitus with other specified complication: Secondary | ICD-10-CM | POA: Diagnosis not present

## 2021-05-12 DIAGNOSIS — E1122 Type 2 diabetes mellitus with diabetic chronic kidney disease: Secondary | ICD-10-CM | POA: Diagnosis not present

## 2021-05-12 DIAGNOSIS — N1831 Chronic kidney disease, stage 3a: Secondary | ICD-10-CM | POA: Diagnosis not present

## 2021-05-12 DIAGNOSIS — I7789 Other specified disorders of arteries and arterioles: Secondary | ICD-10-CM | POA: Diagnosis not present

## 2021-05-12 DIAGNOSIS — M109 Gout, unspecified: Secondary | ICD-10-CM | POA: Diagnosis not present

## 2021-05-12 DIAGNOSIS — Z6836 Body mass index (BMI) 36.0-36.9, adult: Secondary | ICD-10-CM | POA: Diagnosis not present

## 2021-05-12 DIAGNOSIS — Z8673 Personal history of transient ischemic attack (TIA), and cerebral infarction without residual deficits: Secondary | ICD-10-CM | POA: Diagnosis not present

## 2021-05-12 DIAGNOSIS — E782 Mixed hyperlipidemia: Secondary | ICD-10-CM | POA: Diagnosis not present

## 2021-05-12 DIAGNOSIS — R7401 Elevation of levels of liver transaminase levels: Secondary | ICD-10-CM | POA: Diagnosis not present

## 2021-05-15 DIAGNOSIS — J111 Influenza due to unidentified influenza virus with other respiratory manifestations: Secondary | ICD-10-CM | POA: Diagnosis not present

## 2021-05-21 DIAGNOSIS — G4733 Obstructive sleep apnea (adult) (pediatric): Secondary | ICD-10-CM | POA: Insufficient documentation

## 2021-05-21 HISTORY — DX: Obstructive sleep apnea (adult) (pediatric): G47.33

## 2021-05-22 DIAGNOSIS — K8020 Calculus of gallbladder without cholecystitis without obstruction: Secondary | ICD-10-CM | POA: Diagnosis not present

## 2021-05-22 DIAGNOSIS — R748 Abnormal levels of other serum enzymes: Secondary | ICD-10-CM | POA: Diagnosis not present

## 2021-05-28 DIAGNOSIS — Z20822 Contact with and (suspected) exposure to covid-19: Secondary | ICD-10-CM | POA: Diagnosis not present

## 2021-06-02 DIAGNOSIS — K8020 Calculus of gallbladder without cholecystitis without obstruction: Secondary | ICD-10-CM

## 2021-06-02 HISTORY — DX: Calculus of gallbladder without cholecystitis without obstruction: K80.20

## 2021-08-11 ENCOUNTER — Telehealth: Payer: Self-pay

## 2021-08-11 DIAGNOSIS — Z1211 Encounter for screening for malignant neoplasm of colon: Secondary | ICD-10-CM | POA: Diagnosis not present

## 2021-08-11 DIAGNOSIS — Z85038 Personal history of other malignant neoplasm of large intestine: Secondary | ICD-10-CM | POA: Diagnosis not present

## 2021-08-11 HISTORY — DX: Personal history of other malignant neoplasm of large intestine: Z85.038

## 2021-08-11 NOTE — Telephone Encounter (Signed)
° °  Name: William [name]  DOB: Apr 16, 1946  MRN: [id number]   Primary Cardiologist: None  Chart reviewed as part of pre-operative protocol coverage. Patient was contacted 08/11/2021 in reference to pre-operative risk assessment for pending surgery as outlined below.  William [name] was last seen on 03/30/21 by Dr. Geraldo Pitter.  Since that day, William [name] has done well. He is easily getting > 4 METS of activity.   Therefore, based on ACC/AHA guidelines, the patient would be at acceptable risk for the planned procedure without further cardiovascular testing.   The patient was advised that if he develops new symptoms prior to surgery to contact our office to arrange for a follow-up visit, and he verbalized understanding.  I will route this recommendation to the requesting party via Epic fax function and remove from pre-op pool. Please call with questions.  Leanor Kail, PA -C

## 2021-08-11 NOTE — Telephone Encounter (Signed)
° °  Pre-operative Risk Assessment    Patient Name: William [name]  DOB: 11-17-1945 MRN: [id number]      Request for Surgical Clearance    Procedure:   Colonoscopy  Date of Surgery:  Clearance TBD                                 Surgeon:  Dr. Jerel Shepherd Surgeon's Group or Practice Name:  [facility] [place] Phone number:  [phone] Fax number:  [phone]   Type of Clearance Requested:   - Medical    Type of Anesthesia:   Propofol   Additional requests/questions:    Signed, Lowella Grip   08/11/2021, 1:20 PM

## 2021-08-14 DIAGNOSIS — E1122 Type 2 diabetes mellitus with diabetic chronic kidney disease: Secondary | ICD-10-CM | POA: Diagnosis not present

## 2021-08-14 DIAGNOSIS — E782 Mixed hyperlipidemia: Secondary | ICD-10-CM | POA: Diagnosis not present

## 2021-08-14 DIAGNOSIS — Z8673 Personal history of transient ischemic attack (TIA), and cerebral infarction without residual deficits: Secondary | ICD-10-CM | POA: Diagnosis not present

## 2021-08-14 DIAGNOSIS — Z6835 Body mass index (BMI) 35.0-35.9, adult: Secondary | ICD-10-CM | POA: Diagnosis not present

## 2021-08-14 DIAGNOSIS — I7789 Other specified disorders of arteries and arterioles: Secondary | ICD-10-CM | POA: Diagnosis not present

## 2021-08-14 DIAGNOSIS — N1831 Chronic kidney disease, stage 3a: Secondary | ICD-10-CM | POA: Diagnosis not present

## 2021-08-14 DIAGNOSIS — I129 Hypertensive chronic kidney disease with stage 1 through stage 4 chronic kidney disease, or unspecified chronic kidney disease: Secondary | ICD-10-CM | POA: Diagnosis not present

## 2021-08-14 DIAGNOSIS — M109 Gout, unspecified: Secondary | ICD-10-CM | POA: Diagnosis not present

## 2021-09-03 DIAGNOSIS — Z8719 Personal history of other diseases of the digestive system: Secondary | ICD-10-CM | POA: Diagnosis not present

## 2021-09-03 DIAGNOSIS — Z1211 Encounter for screening for malignant neoplasm of colon: Secondary | ICD-10-CM | POA: Diagnosis not present

## 2021-09-03 DIAGNOSIS — R161 Splenomegaly, not elsewhere classified: Secondary | ICD-10-CM | POA: Diagnosis not present

## 2021-09-03 DIAGNOSIS — K6389 Other specified diseases of intestine: Secondary | ICD-10-CM | POA: Diagnosis not present

## 2021-09-03 DIAGNOSIS — E119 Type 2 diabetes mellitus without complications: Secondary | ICD-10-CM | POA: Diagnosis not present

## 2021-09-03 DIAGNOSIS — N400 Enlarged prostate without lower urinary tract symptoms: Secondary | ICD-10-CM | POA: Diagnosis not present

## 2021-09-03 DIAGNOSIS — Z08 Encounter for follow-up examination after completed treatment for malignant neoplasm: Secondary | ICD-10-CM | POA: Diagnosis not present

## 2021-09-03 DIAGNOSIS — Z85038 Personal history of other malignant neoplasm of large intestine: Secondary | ICD-10-CM | POA: Diagnosis not present

## 2021-09-03 DIAGNOSIS — Z7984 Long term (current) use of oral hypoglycemic drugs: Secondary | ICD-10-CM | POA: Diagnosis not present

## 2021-09-10 DIAGNOSIS — E113293 Type 2 diabetes mellitus with mild nonproliferative diabetic retinopathy without macular edema, bilateral: Secondary | ICD-10-CM | POA: Diagnosis not present

## 2021-09-10 DIAGNOSIS — H2513 Age-related nuclear cataract, bilateral: Secondary | ICD-10-CM | POA: Diagnosis not present

## 2021-09-17 ENCOUNTER — Ambulatory Visit: Payer: Medicare Other | Admitting: Adult Health

## 2021-09-17 DIAGNOSIS — R7989 Other specified abnormal findings of blood chemistry: Secondary | ICD-10-CM | POA: Diagnosis not present

## 2021-09-17 DIAGNOSIS — N1831 Chronic kidney disease, stage 3a: Secondary | ICD-10-CM | POA: Diagnosis not present

## 2021-09-17 DIAGNOSIS — R972 Elevated prostate specific antigen [PSA]: Secondary | ICD-10-CM | POA: Diagnosis not present

## 2021-09-17 DIAGNOSIS — N521 Erectile dysfunction due to diseases classified elsewhere: Secondary | ICD-10-CM | POA: Diagnosis not present

## 2021-09-22 NOTE — Progress Notes (Incomplete)
[facility]  [address] [place],  [place]  [address] [phone]  Clinic Day:  09/22/2021  This document serves as a record of services personally performed by Marice Potter, MD. It was created on their behalf by Curry,Lauren E, a trained medical scribe. The creation of this record is based on the scribe's personal observations and the provider's statements to them.  HISTORY OF PRESENT ILLNESS:  The patient is a 76 y.o. male with stage IIIB (T3 N1b M0) colon cancer, status post a right hemicolectomy in February 2019.  This was followed by 4 cycles of adjuvant XELOX chemotherapy, which were completed in May 2019.  He comes in today for routine followup.  Since his last visit, the patient has been doing okay.  Of note, the patient had a TIA.  Although he is back to his baseline, he is on medication to prevent another cerebrovascular insult from developing.   He denies having abdominal pain, rectal bleeding, or other GI symptoms which concern him for disease recurrence.  PHYSICAL EXAM:  There were no vitals taken for this visit. Wt Readings from Last 3 Encounters:  04/02/21 208 lb (94.3 kg)  03/30/21 208 lb 3.2 oz (94.4 kg)  03/30/21 208 lb 12.8 oz (94.7 kg)   There is no height or weight on file to calculate BMI. Performance status (ECOG): 0 - Asymptomatic Physical Exam Constitutional:      Appearance: Normal appearance. He is not ill-appearing.  HENT:     Mouth/Throat:     Mouth: Mucous membranes are moist.     Pharynx: Oropharynx is clear. No oropharyngeal exudate or posterior oropharyngeal erythema.  Cardiovascular:     Rate and Rhythm: Normal rate and regular rhythm.     Heart sounds: No murmur heard.   No friction rub. No gallop.  Pulmonary:     Effort: Pulmonary effort is normal. No respiratory distress.     Breath sounds: Normal breath sounds. No wheezing, rhonchi or rales.  Abdominal:     General: Bowel sounds are normal. There is no  distension.     Palpations: Abdomen is soft. There is no mass.     Tenderness: There is no abdominal tenderness.  Musculoskeletal:        General: No swelling.     Right lower leg: No edema.     Left lower leg: No edema.  Lymphadenopathy:     Cervical: No cervical adenopathy.     Upper Body:     Right upper body: No supraclavicular or axillary adenopathy.     Left upper body: No supraclavicular or axillary adenopathy.     Lower Body: No right inguinal adenopathy. No left inguinal adenopathy.  Skin:    General: Skin is warm.     Coloration: Skin is not jaundiced.     Findings: No lesion or rash.  Neurological:     General: No focal deficit present.     Mental Status: He is alert and oriented to person, place, and time. Mental status is at baseline.  Psychiatric:        Mood and Affect: Mood normal.        Behavior: Behavior normal.        Thought Content: Thought content normal.   LABS:    Ref. Range 03/30/2021 15:40  CEA Latest Ref Range: 0.0 - 4.7 ng/mL 1.3     ASSESSMENT & PLAN:  Assessment/Plan:  A 76 y.o. male with stage IIIB (T3 N1b M0)  colon cancer, status post a right hemicolectomy in February 2019, followed by 4 cycles of adjuvant XELOX chemotherapy, completed in May 2019.  Based upon his physical exam and CEA level, the patient remains disease-free.  Clinically, he is doing well.  I will see him back in 6 months for repeat clinical assessment.  The patient understands all the plans discussed today and is in agreement with them.     I, Rita Ohara, am acting as scribe for Marice Potter, MD    I have reviewed this report as typed by the medical scribe, and it is complete and accurate.  Dequincy Macarthur Critchley, MD

## 2021-09-25 ENCOUNTER — Other Ambulatory Visit: Payer: Self-pay | Admitting: Hematology and Oncology

## 2021-09-25 DIAGNOSIS — C184 Malignant neoplasm of transverse colon: Secondary | ICD-10-CM

## 2021-09-28 ENCOUNTER — Other Ambulatory Visit: Payer: Self-pay

## 2021-09-28 ENCOUNTER — Ambulatory Visit: Payer: Medicare Other | Admitting: Cardiology

## 2021-09-28 ENCOUNTER — Inpatient Hospital Stay: Payer: Medicare Other | Attending: Oncology

## 2021-09-28 DIAGNOSIS — Z79899 Other long term (current) drug therapy: Secondary | ICD-10-CM | POA: Diagnosis not present

## 2021-09-28 DIAGNOSIS — C182 Malignant neoplasm of ascending colon: Secondary | ICD-10-CM | POA: Diagnosis not present

## 2021-09-28 DIAGNOSIS — C184 Malignant neoplasm of transverse colon: Secondary | ICD-10-CM

## 2021-09-29 ENCOUNTER — Other Ambulatory Visit: Payer: Self-pay | Admitting: Oncology

## 2021-09-29 ENCOUNTER — Inpatient Hospital Stay (INDEPENDENT_AMBULATORY_CARE_PROVIDER_SITE_OTHER): Payer: Medicare Other | Admitting: Oncology

## 2021-09-29 VITALS — BP 158/86 | HR 63 | Temp 98.7°F | Resp 16 | Ht 62.0 in | Wt 188.9 lb

## 2021-09-29 DIAGNOSIS — Z20822 Contact with and (suspected) exposure to covid-19: Secondary | ICD-10-CM | POA: Diagnosis not present

## 2021-09-29 DIAGNOSIS — C182 Malignant neoplasm of ascending colon: Secondary | ICD-10-CM

## 2021-09-29 LAB — CEA: CEA: 1.3 ng/mL (ref 0.0–4.7)

## 2021-10-13 ENCOUNTER — Ambulatory Visit: Payer: Medicare Other | Admitting: Cardiology

## 2021-10-17 DIAGNOSIS — Z20822 Contact with and (suspected) exposure to covid-19: Secondary | ICD-10-CM | POA: Diagnosis not present

## 2021-10-29 DIAGNOSIS — R051 Acute cough: Secondary | ICD-10-CM | POA: Diagnosis not present

## 2021-10-29 DIAGNOSIS — R059 Cough, unspecified: Secondary | ICD-10-CM | POA: Diagnosis not present

## 2021-10-29 DIAGNOSIS — Z20822 Contact with and (suspected) exposure to covid-19: Secondary | ICD-10-CM | POA: Diagnosis not present

## 2021-11-02 DIAGNOSIS — Z20822 Contact with and (suspected) exposure to covid-19: Secondary | ICD-10-CM | POA: Diagnosis not present

## 2021-11-03 DIAGNOSIS — R7989 Other specified abnormal findings of blood chemistry: Secondary | ICD-10-CM | POA: Diagnosis not present

## 2021-11-03 DIAGNOSIS — R972 Elevated prostate specific antigen [PSA]: Secondary | ICD-10-CM | POA: Diagnosis not present

## 2021-11-03 DIAGNOSIS — N4883 Acquired buried penis: Secondary | ICD-10-CM | POA: Diagnosis not present

## 2021-11-03 DIAGNOSIS — N528 Other male erectile dysfunction: Secondary | ICD-10-CM | POA: Diagnosis not present

## 2021-11-03 DIAGNOSIS — N521 Erectile dysfunction due to diseases classified elsewhere: Secondary | ICD-10-CM | POA: Diagnosis not present

## 2021-11-03 DIAGNOSIS — Z6839 Body mass index (BMI) 39.0-39.9, adult: Secondary | ICD-10-CM | POA: Diagnosis not present

## 2021-11-03 DIAGNOSIS — N1831 Chronic kidney disease, stage 3a: Secondary | ICD-10-CM | POA: Diagnosis not present

## 2021-11-20 DIAGNOSIS — Z6835 Body mass index (BMI) 35.0-35.9, adult: Secondary | ICD-10-CM | POA: Diagnosis not present

## 2021-11-20 DIAGNOSIS — N1831 Chronic kidney disease, stage 3a: Secondary | ICD-10-CM | POA: Diagnosis not present

## 2021-11-20 DIAGNOSIS — E1122 Type 2 diabetes mellitus with diabetic chronic kidney disease: Secondary | ICD-10-CM | POA: Diagnosis not present

## 2021-11-20 DIAGNOSIS — M109 Gout, unspecified: Secondary | ICD-10-CM | POA: Diagnosis not present

## 2021-11-20 DIAGNOSIS — Z8673 Personal history of transient ischemic attack (TIA), and cerebral infarction without residual deficits: Secondary | ICD-10-CM | POA: Diagnosis not present

## 2021-11-20 DIAGNOSIS — I129 Hypertensive chronic kidney disease with stage 1 through stage 4 chronic kidney disease, or unspecified chronic kidney disease: Secondary | ICD-10-CM | POA: Diagnosis not present

## 2021-11-20 DIAGNOSIS — Z20822 Contact with and (suspected) exposure to covid-19: Secondary | ICD-10-CM | POA: Diagnosis not present

## 2021-11-20 DIAGNOSIS — I7789 Other specified disorders of arteries and arterioles: Secondary | ICD-10-CM | POA: Diagnosis not present

## 2021-11-20 DIAGNOSIS — E782 Mixed hyperlipidemia: Secondary | ICD-10-CM | POA: Diagnosis not present

## 2021-11-23 DIAGNOSIS — Z20822 Contact with and (suspected) exposure to covid-19: Secondary | ICD-10-CM | POA: Diagnosis not present

## 2021-11-25 DIAGNOSIS — N1831 Chronic kidney disease, stage 3a: Secondary | ICD-10-CM | POA: Diagnosis not present

## 2021-11-25 DIAGNOSIS — E1169 Type 2 diabetes mellitus with other specified complication: Secondary | ICD-10-CM | POA: Diagnosis not present

## 2021-11-25 DIAGNOSIS — E1122 Type 2 diabetes mellitus with diabetic chronic kidney disease: Secondary | ICD-10-CM | POA: Diagnosis not present

## 2021-11-25 DIAGNOSIS — I69398 Other sequelae of cerebral infarction: Secondary | ICD-10-CM | POA: Diagnosis not present

## 2021-11-25 DIAGNOSIS — I129 Hypertensive chronic kidney disease with stage 1 through stage 4 chronic kidney disease, or unspecified chronic kidney disease: Secondary | ICD-10-CM | POA: Diagnosis not present

## 2021-11-25 DIAGNOSIS — N529 Male erectile dysfunction, unspecified: Secondary | ICD-10-CM | POA: Diagnosis not present

## 2021-11-25 DIAGNOSIS — G4733 Obstructive sleep apnea (adult) (pediatric): Secondary | ICD-10-CM | POA: Diagnosis not present

## 2021-11-25 DIAGNOSIS — Z6839 Body mass index (BMI) 39.0-39.9, adult: Secondary | ICD-10-CM | POA: Diagnosis not present

## 2021-11-25 DIAGNOSIS — N486 Induration penis plastica: Secondary | ICD-10-CM | POA: Diagnosis not present

## 2021-11-25 DIAGNOSIS — Z7902 Long term (current) use of antithrombotics/antiplatelets: Secondary | ICD-10-CM | POA: Diagnosis not present

## 2021-11-25 DIAGNOSIS — Z79899 Other long term (current) drug therapy: Secondary | ICD-10-CM | POA: Diagnosis not present

## 2021-11-25 DIAGNOSIS — Z85038 Personal history of other malignant neoplasm of large intestine: Secondary | ICD-10-CM | POA: Diagnosis not present

## 2021-11-25 DIAGNOSIS — Z9989 Dependence on other enabling machines and devices: Secondary | ICD-10-CM | POA: Diagnosis not present

## 2021-11-25 DIAGNOSIS — E785 Hyperlipidemia, unspecified: Secondary | ICD-10-CM | POA: Diagnosis not present

## 2021-11-25 DIAGNOSIS — M109 Gout, unspecified: Secondary | ICD-10-CM | POA: Diagnosis not present

## 2021-11-25 DIAGNOSIS — N521 Erectile dysfunction due to diseases classified elsewhere: Secondary | ICD-10-CM | POA: Diagnosis not present

## 2021-11-26 DIAGNOSIS — N1831 Chronic kidney disease, stage 3a: Secondary | ICD-10-CM | POA: Diagnosis not present

## 2021-11-26 DIAGNOSIS — E1122 Type 2 diabetes mellitus with diabetic chronic kidney disease: Secondary | ICD-10-CM | POA: Diagnosis not present

## 2021-11-26 DIAGNOSIS — N486 Induration penis plastica: Secondary | ICD-10-CM | POA: Diagnosis not present

## 2021-11-26 DIAGNOSIS — I129 Hypertensive chronic kidney disease with stage 1 through stage 4 chronic kidney disease, or unspecified chronic kidney disease: Secondary | ICD-10-CM | POA: Diagnosis not present

## 2021-11-26 DIAGNOSIS — N521 Erectile dysfunction due to diseases classified elsewhere: Secondary | ICD-10-CM | POA: Diagnosis not present

## 2021-11-26 DIAGNOSIS — E1169 Type 2 diabetes mellitus with other specified complication: Secondary | ICD-10-CM | POA: Diagnosis not present

## 2022-02-24 DIAGNOSIS — N1831 Chronic kidney disease, stage 3a: Secondary | ICD-10-CM | POA: Diagnosis not present

## 2022-02-24 DIAGNOSIS — Z1331 Encounter for screening for depression: Secondary | ICD-10-CM | POA: Diagnosis not present

## 2022-02-24 DIAGNOSIS — E113293 Type 2 diabetes mellitus with mild nonproliferative diabetic retinopathy without macular edema, bilateral: Secondary | ICD-10-CM | POA: Diagnosis not present

## 2022-02-24 DIAGNOSIS — I129 Hypertensive chronic kidney disease with stage 1 through stage 4 chronic kidney disease, or unspecified chronic kidney disease: Secondary | ICD-10-CM | POA: Diagnosis not present

## 2022-02-24 DIAGNOSIS — E782 Mixed hyperlipidemia: Secondary | ICD-10-CM | POA: Diagnosis not present

## 2022-02-24 DIAGNOSIS — I7789 Other specified disorders of arteries and arterioles: Secondary | ICD-10-CM | POA: Diagnosis not present

## 2022-02-24 DIAGNOSIS — Z6835 Body mass index (BMI) 35.0-35.9, adult: Secondary | ICD-10-CM | POA: Diagnosis not present

## 2022-02-24 DIAGNOSIS — M109 Gout, unspecified: Secondary | ICD-10-CM | POA: Diagnosis not present

## 2022-02-24 DIAGNOSIS — Z9181 History of falling: Secondary | ICD-10-CM | POA: Diagnosis not present

## 2022-02-24 DIAGNOSIS — Z139 Encounter for screening, unspecified: Secondary | ICD-10-CM | POA: Diagnosis not present

## 2022-02-24 DIAGNOSIS — E1122 Type 2 diabetes mellitus with diabetic chronic kidney disease: Secondary | ICD-10-CM | POA: Diagnosis not present

## 2022-02-24 DIAGNOSIS — Z8673 Personal history of transient ischemic attack (TIA), and cerebral infarction without residual deficits: Secondary | ICD-10-CM | POA: Diagnosis not present

## 2022-03-31 ENCOUNTER — Inpatient Hospital Stay: Payer: Medicare Other | Attending: Oncology

## 2022-03-31 DIAGNOSIS — Z85038 Personal history of other malignant neoplasm of large intestine: Secondary | ICD-10-CM | POA: Insufficient documentation

## 2022-03-31 DIAGNOSIS — Z9221 Personal history of antineoplastic chemotherapy: Secondary | ICD-10-CM | POA: Insufficient documentation

## 2022-03-31 DIAGNOSIS — C182 Malignant neoplasm of ascending colon: Secondary | ICD-10-CM

## 2022-03-31 NOTE — Progress Notes (Signed)
[facility]  [address] [place],  [place]  [address] [phone]  Clinic Day:  04/01/2022  HISTORY OF PRESENT ILLNESS:  The patient is a 76 y.o. male with stage IIIB (T3 N1b M0) colon cancer, status post a right hemicolectomy in February 2019.  This was followed by 4 cycles of adjuvant XELOX chemotherapy, which were completed in May 2019.  He comes in today for routine followup.  Since his last visit, the patient has been doing okay.  He denies having abdominal pain, rectal bleeding, or other GI symptoms which concern him for disease recurrence.  PHYSICAL EXAM:  Blood pressure (!) 137/90, pulse (!) 116, temperature 98.9 F (37.2 C), resp. rate 16, height '5\' 2"'$  (1.575 m), weight 197 lb 14.4 oz (89.8 kg), SpO2 94 %. Wt Readings from Last 3 Encounters:  04/01/22 197 lb 14.4 oz (89.8 kg)  09/29/21 188 lb 14.4 oz (85.7 kg)  04/02/21 208 lb (94.3 kg)   Body mass index is 36.2 kg/m. Performance status (ECOG): 0 - Asymptomatic Physical Exam Constitutional:      Appearance: Normal appearance. He is not ill-appearing.  HENT:     Mouth/Throat:     Mouth: Mucous membranes are moist.     Pharynx: Oropharynx is clear. No oropharyngeal exudate or posterior oropharyngeal erythema.  Cardiovascular:     Rate and Rhythm: Normal rate and regular rhythm.     Heart sounds: No murmur heard.    No friction rub. No gallop.  Pulmonary:     Effort: Pulmonary effort is normal. No respiratory distress.     Breath sounds: Normal breath sounds. No wheezing, rhonchi or rales.  Abdominal:     General: Bowel sounds are normal. There is no distension.     Palpations: Abdomen is soft. There is no mass.     Tenderness: There is no abdominal tenderness.  Musculoskeletal:        General: No swelling.     Right lower leg: No edema.     Left lower leg: No edema.  Lymphadenopathy:     Cervical: No cervical adenopathy.     Upper Body:     Right upper body: No supraclavicular  or axillary adenopathy.     Left upper body: No supraclavicular or axillary adenopathy.     Lower Body: No right inguinal adenopathy. No left inguinal adenopathy.  Skin:    General: Skin is warm.     Coloration: Skin is not jaundiced.     Findings: No lesion or rash.  Neurological:     General: No focal deficit present.     Mental Status: He is alert and oriented to person, place, and time. Mental status is at baseline.  Psychiatric:        Mood and Affect: Mood normal.        Behavior: Behavior normal.        Thought Content: Thought content normal.   LABS:     ASSESSMENT & PLAN:  Assessment/Plan:  A 76 y.o. male with stage IIIB (T3 N1b M0) colon cancer, status post a right hemicolectomy in February 2019, followed by 4 cycles of adjuvant XELOX chemotherapy, completed in May 2019.  Based upon his physical exam and CEA level, the patient remains disease-free.  Clinically, he is doing well.  I will see him back in May 2024 for repeat clinical assessment.  If his next CEA and physical exam both come back normal, it will mark him being 5 years cancer  free for which I would consider him cured of his disease.  The patient understands all the plans discussed today and is in agreement with them.     I, Rita Ohara, am acting as scribe for Marice Potter, MD    I have reviewed this report as typed by the medical scribe, and it is complete and accurate.  Kedrick Mcnamee Macarthur Critchley, MD

## 2022-04-01 ENCOUNTER — Inpatient Hospital Stay (INDEPENDENT_AMBULATORY_CARE_PROVIDER_SITE_OTHER): Payer: Medicare Other | Admitting: Oncology

## 2022-04-01 VITALS — BP 137/90 | HR 116 | Temp 98.9°F | Resp 16 | Ht 62.0 in | Wt 197.9 lb

## 2022-04-01 DIAGNOSIS — Z85038 Personal history of other malignant neoplasm of large intestine: Secondary | ICD-10-CM

## 2022-04-01 DIAGNOSIS — G459 Transient cerebral ischemic attack, unspecified: Secondary | ICD-10-CM | POA: Diagnosis not present

## 2022-04-01 NOTE — Progress Notes (Incomplete)
[facility]  [address] [place],  [place]  [address] [phone]  Clinic Day:  04/01/2022  HISTORY OF PRESENT ILLNESS:  The patient is a 76 y.o. male with stage IIIB (T3 N1b M0) colon cancer, status post a right hemicolectomy in February 2019.  This was followed by 4 cycles of adjuvant XELOX chemotherapy, which were completed in May 2019.  He comes in today for routine followup.  Since his last visit, the patient has been doing okay.  He denies having abdominal pain, rectal bleeding, or other GI symptoms which concern him for disease recurrence.  PHYSICAL EXAM:  Blood pressure (!) 137/90, pulse (!) 116, temperature 98.9 F (37.2 C), resp. rate 16, height '5\' 2"'$  (1.575 m), weight 197 lb 14.4 oz (89.8 kg), SpO2 94 %. Wt Readings from Last 3 Encounters:  04/01/22 197 lb 14.4 oz (89.8 kg)  09/29/21 188 lb 14.4 oz (85.7 kg)  04/02/21 208 lb (94.3 kg)   Body mass index is 36.2 kg/m. Performance status (ECOG): 0 - Asymptomatic Physical Exam Constitutional:      Appearance: Normal appearance. He is not ill-appearing.  HENT:     Mouth/Throat:     Mouth: Mucous membranes are moist.     Pharynx: Oropharynx is clear. No oropharyngeal exudate or posterior oropharyngeal erythema.  Cardiovascular:     Rate and Rhythm: Normal rate and regular rhythm.     Heart sounds: No murmur heard.    No friction rub. No gallop.  Pulmonary:     Effort: Pulmonary effort is normal. No respiratory distress.     Breath sounds: Normal breath sounds. No wheezing, rhonchi or rales.  Abdominal:     General: Bowel sounds are normal. There is no distension.     Palpations: Abdomen is soft. There is no mass.     Tenderness: There is no abdominal tenderness.  Musculoskeletal:        General: No swelling.     Right lower leg: No edema.     Left lower leg: No edema.  Lymphadenopathy:     Cervical: No cervical adenopathy.     Upper Body:     Right upper body: No supraclavicular  or axillary adenopathy.     Left upper body: No supraclavicular or axillary adenopathy.     Lower Body: No right inguinal adenopathy. No left inguinal adenopathy.  Skin:    General: Skin is warm.     Coloration: Skin is not jaundiced.     Findings: No lesion or rash.  Neurological:     General: No focal deficit present.     Mental Status: He is alert and oriented to person, place, and time. Mental status is at baseline.  Psychiatric:        Mood and Affect: Mood normal.        Behavior: Behavior normal.        Thought Content: Thought content normal.    LABS:   CEA pending  ASSESSMENT & PLAN:  Assessment/Plan:  A 76 y.o. male with stage IIIB (T3 N1b M0) colon cancer, status post a right hemicolectomy in February 2019, followed by 4 cycles of adjuvant XELOX chemotherapy, completed in May 2019.  Based upon his physical exam and CEA level, the patient remains disease-free.  Clinically, he is doing well.  I will see him back in May 2024 for repeat clinical assessment.  If his next CEA and physical exam both come back normal, it will mark him being 5  years cancer free for which I would consider him cured of his disease.  The patient understands all the plans discussed today and is in agreement with them.     I, Rita Ohara, am acting as scribe for Marice Potter, MD    I have reviewed this report as typed by the medical scribe, and it is complete and accurate.  Karol Liendo Macarthur Critchley, MD

## 2022-04-02 ENCOUNTER — Telehealth: Payer: Self-pay | Admitting: Oncology

## 2022-04-02 LAB — CEA: CEA: 1.2 ng/mL (ref 0.0–4.7)

## 2022-04-02 NOTE — Telephone Encounter (Signed)
Contacted pt to schedule an appt per 04/01/22 LOS. Unable to reach via phone, voicemail was left.

## 2022-05-31 DIAGNOSIS — Z6836 Body mass index (BMI) 36.0-36.9, adult: Secondary | ICD-10-CM | POA: Diagnosis not present

## 2022-05-31 DIAGNOSIS — G4733 Obstructive sleep apnea (adult) (pediatric): Secondary | ICD-10-CM | POA: Diagnosis not present

## 2022-06-01 DIAGNOSIS — N1831 Chronic kidney disease, stage 3a: Secondary | ICD-10-CM | POA: Diagnosis not present

## 2022-06-01 DIAGNOSIS — Z125 Encounter for screening for malignant neoplasm of prostate: Secondary | ICD-10-CM | POA: Diagnosis not present

## 2022-06-01 DIAGNOSIS — I129 Hypertensive chronic kidney disease with stage 1 through stage 4 chronic kidney disease, or unspecified chronic kidney disease: Secondary | ICD-10-CM | POA: Diagnosis not present

## 2022-06-01 DIAGNOSIS — E1122 Type 2 diabetes mellitus with diabetic chronic kidney disease: Secondary | ICD-10-CM | POA: Diagnosis not present

## 2022-06-01 DIAGNOSIS — E782 Mixed hyperlipidemia: Secondary | ICD-10-CM | POA: Diagnosis not present

## 2022-06-01 DIAGNOSIS — E113293 Type 2 diabetes mellitus with mild nonproliferative diabetic retinopathy without macular edema, bilateral: Secondary | ICD-10-CM | POA: Diagnosis not present

## 2022-06-01 DIAGNOSIS — Z8673 Personal history of transient ischemic attack (TIA), and cerebral infarction without residual deficits: Secondary | ICD-10-CM | POA: Diagnosis not present

## 2022-06-01 DIAGNOSIS — M109 Gout, unspecified: Secondary | ICD-10-CM | POA: Diagnosis not present

## 2022-06-01 DIAGNOSIS — Z6837 Body mass index (BMI) 37.0-37.9, adult: Secondary | ICD-10-CM | POA: Diagnosis not present

## 2022-06-01 DIAGNOSIS — I7789 Other specified disorders of arteries and arterioles: Secondary | ICD-10-CM | POA: Diagnosis not present

## 2022-06-29 DIAGNOSIS — R7989 Other specified abnormal findings of blood chemistry: Secondary | ICD-10-CM | POA: Diagnosis not present

## 2022-07-05 ENCOUNTER — Other Ambulatory Visit: Payer: Self-pay | Admitting: Oncology

## 2022-07-05 DIAGNOSIS — C184 Malignant neoplasm of transverse colon: Secondary | ICD-10-CM

## 2022-07-05 NOTE — Progress Notes (Signed)
[name]  [address] [place],  [place]  [address] [phone]  Clinic Day:  07/06/2022  HISTORY OF PRESENT ILLNESS:  The patient is a 76 y.o. male with stage IIIB (T3 N1b M0) colon cancer, status post a right hemicolectomy in February 2019.  This was followed by 4 cycles of adjuvant XELOX chemotherapy, which were completed in May 2019.  However, the patient comes in today as recent labs at his primary care office showed leukocytosis and thrombocythemia.  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  Pathology from his splenectomy specimen did not reveal any type of malignant pathology.  The patient has been told in the past how his peripheral counts will likely be  chronically elevated after his splenectomy, which has been the case since February 2019.  The patient claims that recent blood work at his primary care office was done as part of routine workup.  He denies having any fevers, infections, or inflammatory processes at the time his CBC was checked.  PHYSICAL EXAM:  Blood pressure (!) 147/91, pulse 99, temperature 98.3 F (36.8 C), resp. rate 16, height 5\' 2"  (1.575 m), weight 199 lb 1.6 oz (90.3 kg), SpO2 96 %. Wt Readings from Last 3 Encounters:  07/06/22 199 lb 1.6 oz (90.3 kg)  04/01/22 197 lb 14.4 oz (89.8 kg)  09/29/21 188 lb 14.4 oz (85.7 kg)   Body mass index is 36.42 kg/m. Performance status (ECOG): 0 - Asymptomatic Physical Exam Constitutional:      Appearance: Normal appearance. He is not ill-appearing.  HENT:     Mouth/Throat:     Mouth: Mucous membranes are moist.     Pharynx: Oropharynx is clear. No oropharyngeal exudate or posterior oropharyngeal erythema.  Cardiovascular:     Rate and Rhythm: Normal rate and regular rhythm.     Heart sounds: No murmur heard.    No friction rub. No gallop.  Pulmonary:     Effort:  Pulmonary effort is normal. No respiratory distress.     Breath sounds: Normal breath sounds. No wheezing, rhonchi or rales.  Abdominal:     General: Bowel sounds are normal. There is no distension.     Palpations: Abdomen is soft. There is no mass.     Tenderness: There is no abdominal tenderness.  Musculoskeletal:        General: No swelling.     Right lower leg: No edema.     Left lower leg: No edema.  Lymphadenopathy:     Cervical: No cervical adenopathy.     Upper Body:     Right upper body: No supraclavicular or axillary adenopathy.     Left upper body: No supraclavicular or axillary adenopathy.     Lower Body: No right inguinal adenopathy. No left inguinal adenopathy.  Skin:    General: Skin is warm.     Coloration: Skin is not jaundiced.     Findings: No lesion or rash.  Neurological:     General: No focal deficit present.     Mental Status: He is alert and oriented to person, place, and time. Mental status is at baseline.  Psychiatric:        Mood and Affect: Mood normal.        Behavior: Behavior normal.        Thought Content: Thought content normal.    LABS:  ASSESSMENT & PLAN:  Assessment/Plan:  A 76 y.o. male with stage IIIB (T3 N1b M0) colon cancer, status post a right hemicolectomy in February 2019, followed by 4 cycles of adjuvant XELOX chemotherapy, completed in May 2019.  However, as mentioned previously, he comes in today due to labs at his primary care office showing leukocytosis and thrombocythemia.  In clinic today, I went over all his CBC with him, for which he could see that his white cells and platelets are elevated.  I also went back over his labs over the past 10 years that were collected at the hospital, for which he could see how his white cells and platelets abruptly rose after he underwent a splenectomy in February 2019 and have remained elevated since then.  There is nothing per his recent history or physical exam to suggest a more ominous  hematologic process is present.  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  This gentleman is already scheduled to see me back in a few months for his continued colon cancer surveillance.  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  The patient understands all the plans discussed today and is in agreement with them  William [facility] Kirby Funk, MD

## 2022-07-06 ENCOUNTER — Inpatient Hospital Stay: Payer: Medicare Other

## 2022-07-06 ENCOUNTER — Inpatient Hospital Stay: Payer: Medicare Other | Attending: Oncology | Admitting: Oncology

## 2022-07-06 VITALS — BP 147/91 | HR 99 | Temp 98.3°F | Resp 16 | Ht 62.0 in | Wt 199.1 lb

## 2022-07-06 DIAGNOSIS — Z85038 Personal history of other malignant neoplasm of large intestine: Secondary | ICD-10-CM | POA: Diagnosis not present

## 2022-07-06 DIAGNOSIS — D75839 Thrombocytosis, unspecified: Secondary | ICD-10-CM

## 2022-07-06 DIAGNOSIS — D649 Anemia, unspecified: Secondary | ICD-10-CM | POA: Diagnosis not present

## 2022-07-06 DIAGNOSIS — C189 Malignant neoplasm of colon, unspecified: Secondary | ICD-10-CM | POA: Diagnosis not present

## 2022-07-06 DIAGNOSIS — D500 Iron deficiency anemia secondary to blood loss (chronic): Secondary | ICD-10-CM | POA: Diagnosis not present

## 2022-07-06 DIAGNOSIS — C184 Malignant neoplasm of transverse colon: Secondary | ICD-10-CM

## 2022-07-06 LAB — CBC AND DIFFERENTIAL
HCT: 47 (ref 41–53)
Hemoglobin: 15.8 (ref 13.5–17.5)
Neutrophils Absolute: 7.45
Platelets: 408 10*3/uL — AB (ref 150–400)
WBC: 13.8

## 2022-07-06 LAB — CBC: RBC: 4.92 (ref 3.87–5.11)

## 2022-07-07 IMAGING — MR MR HEAD W/O CM
9 of 16 series · 16 of 48 positions shown · IV contrast (Yes GAD)
Comparison: 0100

CLINICAL DATA: TIA

EXAM:
MRI HEAD WITHOUT CONTRAST
MRA HEAD WITHOUT CONTRAST
MRA NECK WITHOUT AND WITH CONTRAST
TECHNIQUE: Multiplanar, multi-echo pulse sequences of the brain and surrounding
structures were acquired without intravenous contrast. Angiographic
images of the Circle of Willis were acquired using MRA technique
without intravenous contrast. Angiographic images of the neck were
acquired using MRA technique without and with intravenous contrast.
Carotid stenosis measurements (when applicable) are obtained
utilizing NASCET criteria, using the distal internal carotid
diameter as the denominator.
CONTRAST:  9mL GADAVIST GADOBUTROL 1 MMOL/ML IV SOLN

[Series 2: DWI · axial · 3.0mm · 0.94mm/px · z∈[-14,+121]mm · 3 of 94 slices shown (1 of 2)]
[im 1/94]
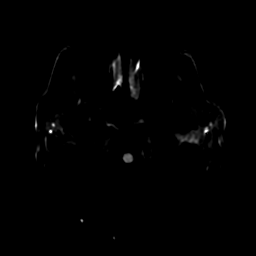
[im 47/94]
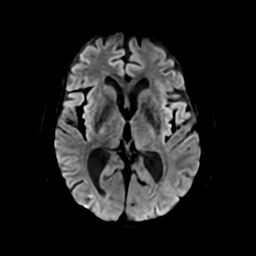
[im 94/94]
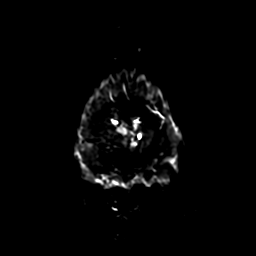

[Series 3: ax (id) · axial · 1.0mm · 0.43mm/px · z∈[-16,+12]mm · 3 of 176 slices shown]
[im 1/176]
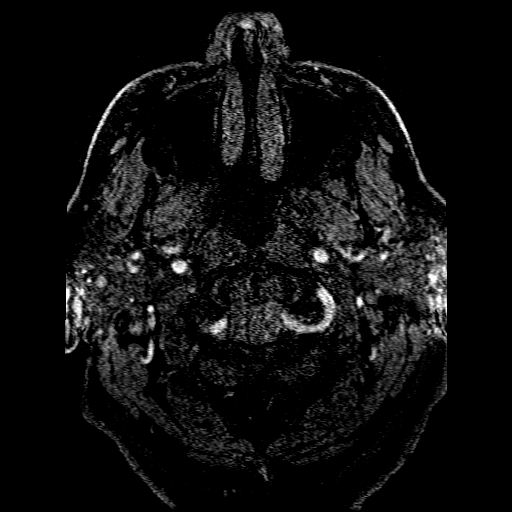
[im 30/176]
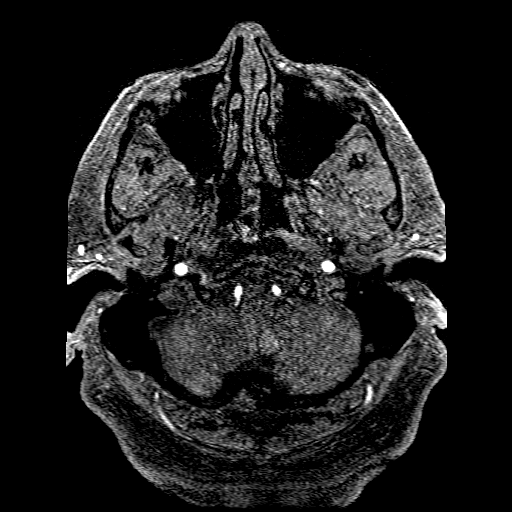
[im 59/176]
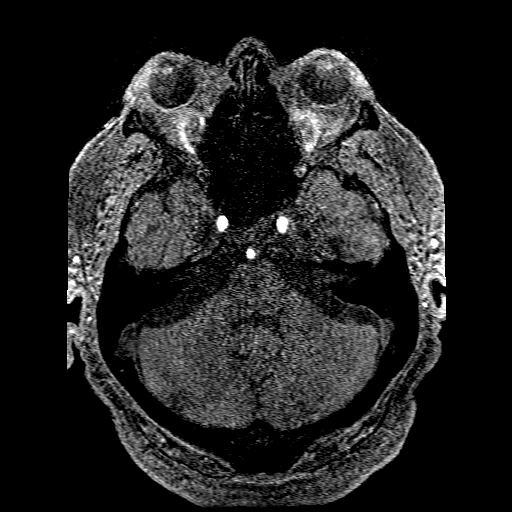

[Series 4: DWI · coronal · 4.0mm · 0.94mm/px · 3 of 74 slices shown (2 of 2)]
[im 1/74]
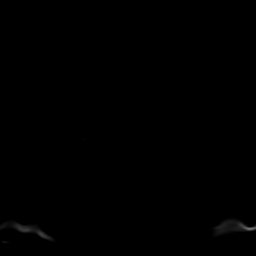
[im 37/74]
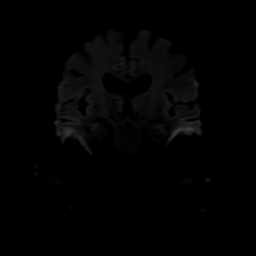
[im 74/74]
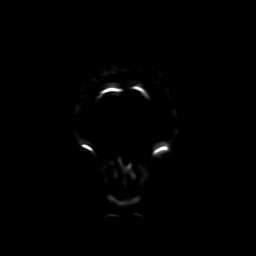

[Series 5: FLAIR · sagittal · 5.0mm · 0.23mm/px · 1 of 25 slices shown (1 of 2)]
[im 1/25]
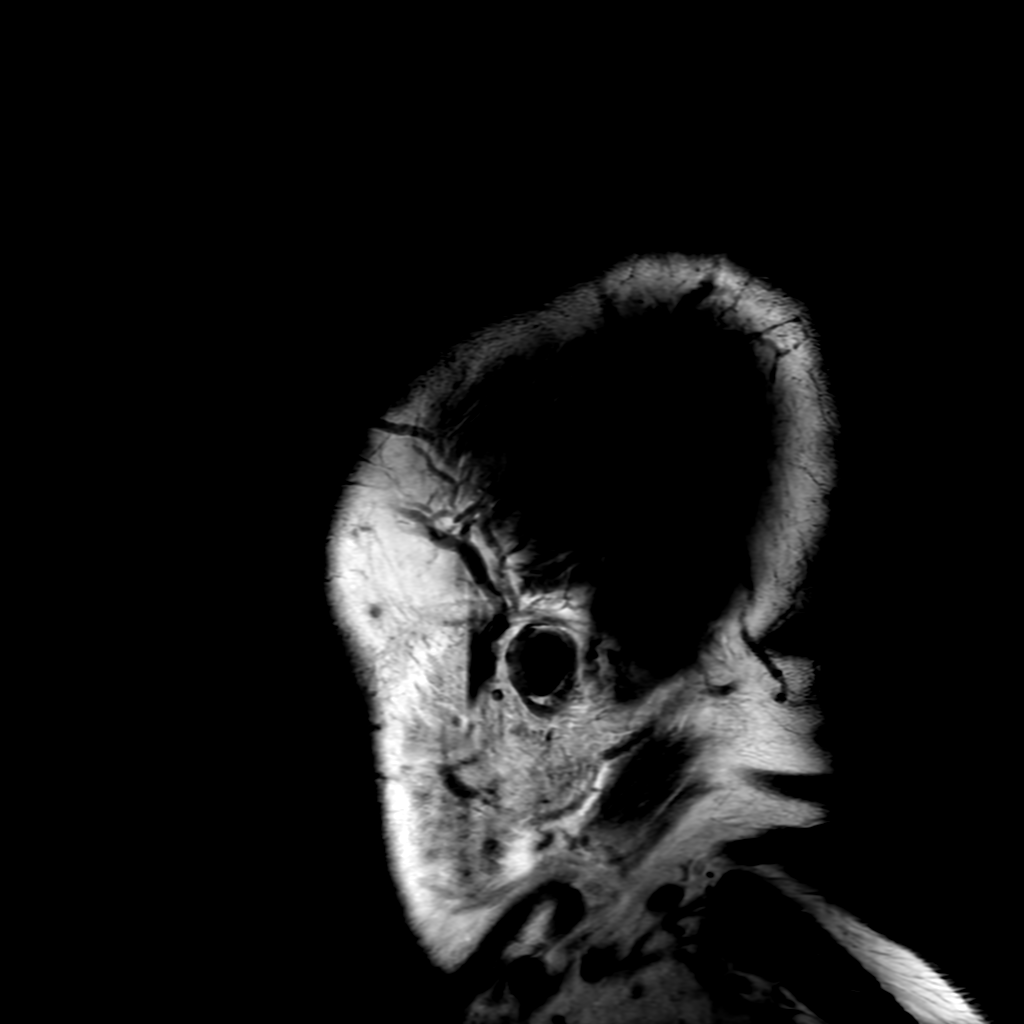

[Series 6: T2 · axial · 5.0mm · 0.23mm/px · 1 of 24 slices shown (1 of 2)]
[im 1/24]
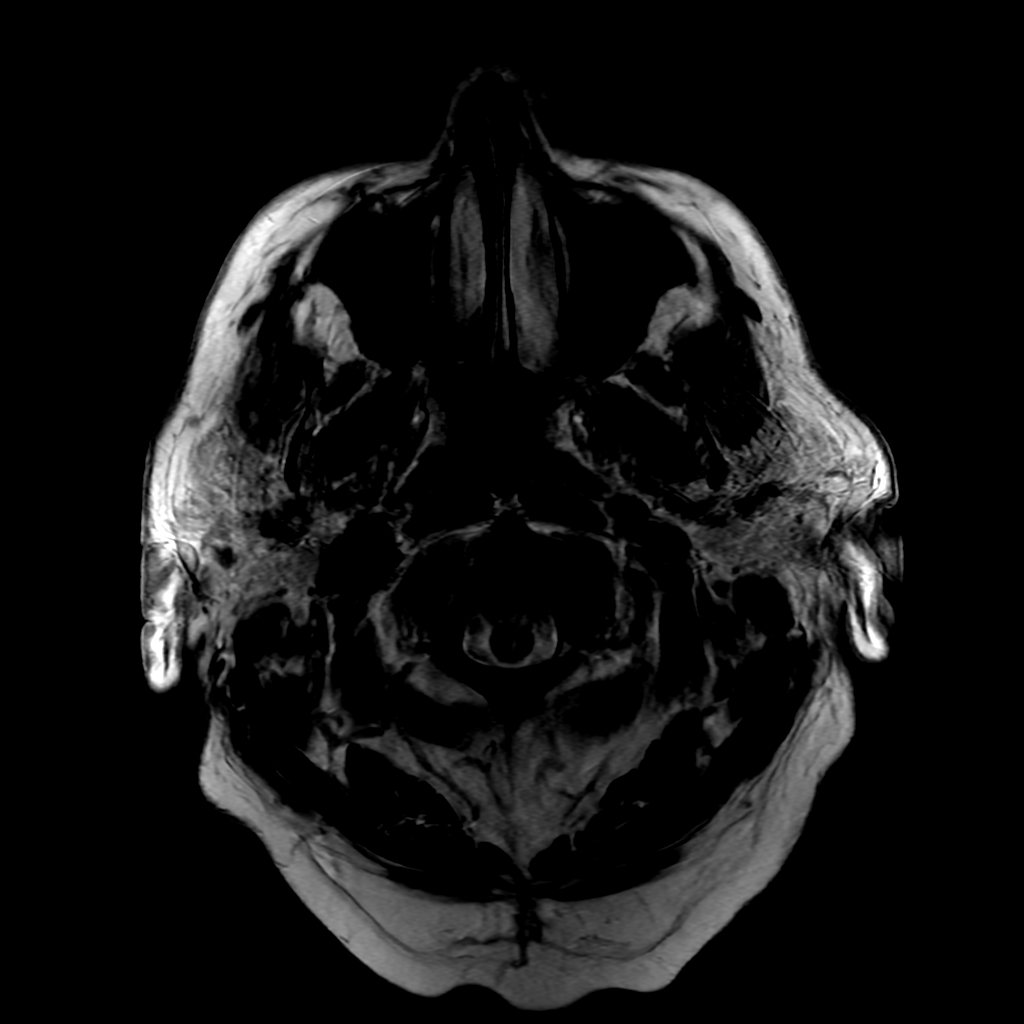

[Series 7: FLAIR · axial · 4.0mm · 0.45mm/px · 1 of 33 slices shown (2 of 2)]
[im 1/33]
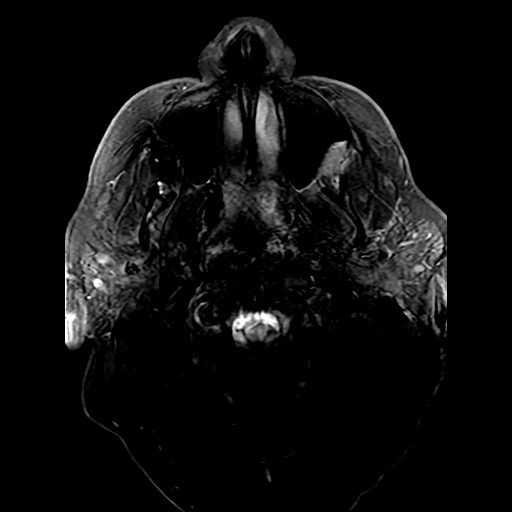

[Series 10: T2 · coronal · 5.0mm · 0.20mm/px · 1 of 31 slices shown (2 of 2)]
[im 1/31]
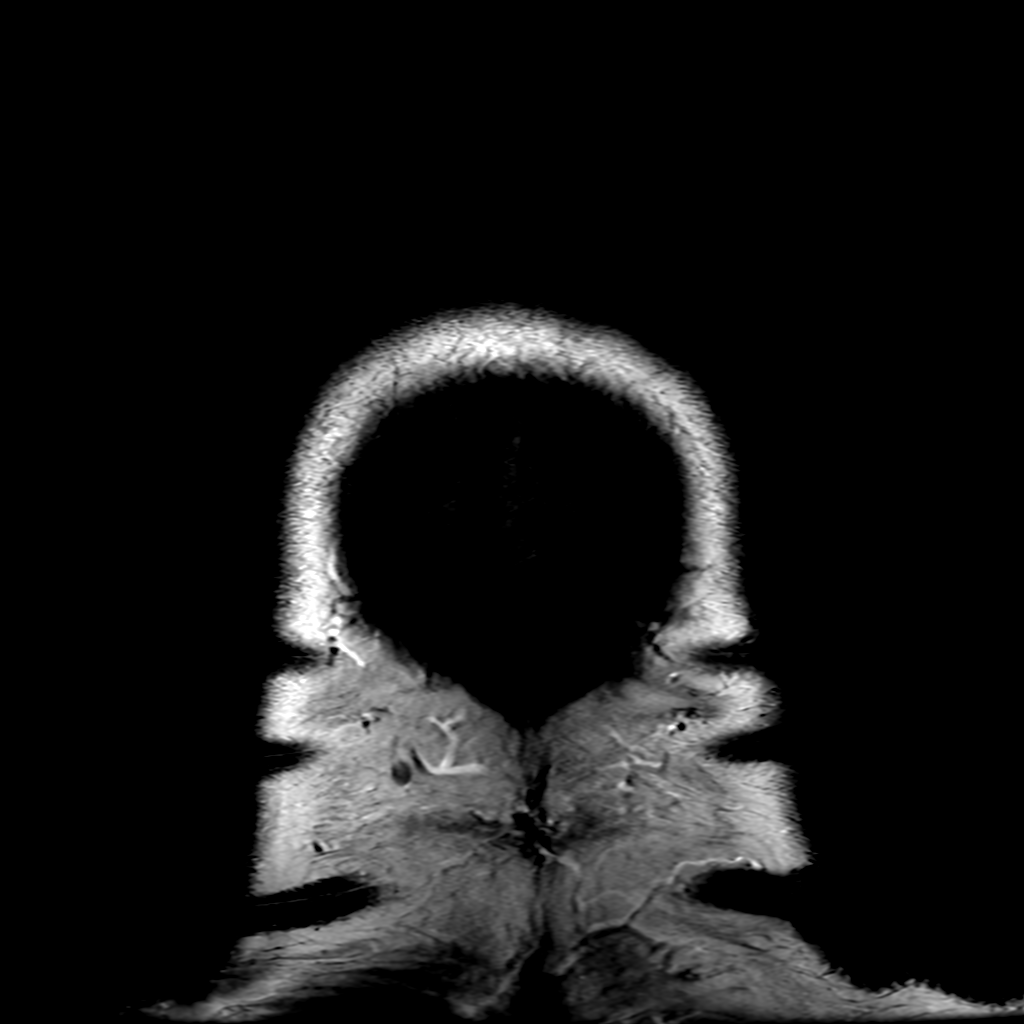

[Series 250: ADC · axial · 3.0mm · 0.94mm/px · z∈[-14,+121]mm · 2 of 47 slices shown (1 of 2)]
[im 1/47]
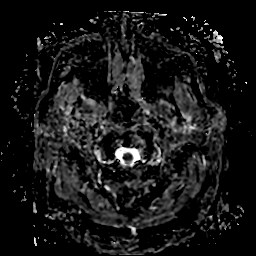
[im 47/47]
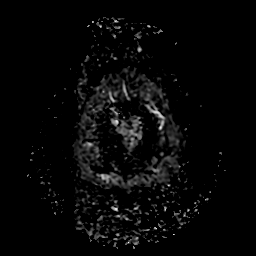

[Series 450: ADC · coronal · 4.0mm · 0.94mm/px · 1 of 37 slices shown (2 of 2)]
[im 1/37]
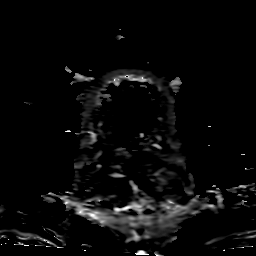

[16 of 48 positions shown; findings below may reference images not displayed]

FINDINGS: MRI HEAD

Brain: There is no acute infarction or intracranial hemorrhage.
There is no intracranial mass, mass effect, or edema. There is no
hydrocephalus or extra-axial fluid collection.

Prominence of the ventricles and sulci reflects generalized
parenchymal volume loss. Small chronic right thalamic and left
caudate infarcts. Chronic right frontal and bilateral
parietooccipital infarcts. Additional patchy foci of T2
hyperintensity in the supratentorial white matter are nonspecific
but may reflect mild chronic microvascular ischemic changes.
Punctate focus of susceptibility in the right parietal subcortical
white matter is most compatible with a chronic microhemorrhage.

Vascular: Major vessel flow voids at the skull base are preserved.

Skull and upper cervical spine: Normal marrow signal is preserved.

Sinuses/Orbits: Paranasal sinuses are aerated. Orbits are
unremarkable.

Other: Sella is unremarkable.  Mastoid air cells are clear.

MRA HEAD

Intracranial internal carotid arteries are patent. There is a 2 mm
superiorly directed outpouching from the distal supraclinoid right
ICA (series 3, images 103). And anterior cerebral arteries are
patent. Intracranial vertebral arteries, basilar artery, posterior
cerebral arteries are patent. Focal mild stenosis of the right P2
PCA. There is no significant stenosis.

MRA NECK

Common carotid arteries are patent, noting that proximal portions
are not as well evaluated due to artifact. Internal and external
carotid arteries are patent. Atherosclerosis is present at the ICA
origins without hemodynamically significant stenosis. Stenosis
measurement is suboptimal due to artifact. Partially retropharyngeal
course.

Extracranial vertebral arteries are patent, noting that the proximal
V1 segments are not as well evaluated due to artifact.
IMPRESSION: No acute infarction, hemorrhage, or mass.

Mild chronic microvascular ischemic changes. Chronic infarcts as
described.

Suboptimal vascular imaging of the neck due to motion artifact. No
hemodynamically significant stenosis.

Mild right P2 PCA stenosis.  No high-grade intracranial stenosis.

Small superiorly directed outpouching of the distal supraclinoid
right ICA likely reflecting an aneurysm.

## 2022-09-03 DIAGNOSIS — E1122 Type 2 diabetes mellitus with diabetic chronic kidney disease: Secondary | ICD-10-CM | POA: Diagnosis not present

## 2022-09-03 DIAGNOSIS — I129 Hypertensive chronic kidney disease with stage 1 through stage 4 chronic kidney disease, or unspecified chronic kidney disease: Secondary | ICD-10-CM | POA: Diagnosis not present

## 2022-09-03 DIAGNOSIS — E113293 Type 2 diabetes mellitus with mild nonproliferative diabetic retinopathy without macular edema, bilateral: Secondary | ICD-10-CM | POA: Diagnosis not present

## 2022-09-03 DIAGNOSIS — Z6836 Body mass index (BMI) 36.0-36.9, adult: Secondary | ICD-10-CM | POA: Diagnosis not present

## 2022-09-03 DIAGNOSIS — E782 Mixed hyperlipidemia: Secondary | ICD-10-CM | POA: Diagnosis not present

## 2022-09-03 DIAGNOSIS — N1831 Chronic kidney disease, stage 3a: Secondary | ICD-10-CM | POA: Diagnosis not present

## 2022-09-15 DIAGNOSIS — E113293 Type 2 diabetes mellitus with mild nonproliferative diabetic retinopathy without macular edema, bilateral: Secondary | ICD-10-CM | POA: Diagnosis not present

## 2022-10-01 DIAGNOSIS — H04331 Acute lacrimal canaliculitis of right lacrimal passage: Secondary | ICD-10-CM | POA: Diagnosis not present

## 2022-10-06 DIAGNOSIS — H04331 Acute lacrimal canaliculitis of right lacrimal passage: Secondary | ICD-10-CM | POA: Diagnosis not present

## 2022-11-15 DIAGNOSIS — M25562 Pain in left knee: Secondary | ICD-10-CM | POA: Diagnosis not present

## 2022-11-15 DIAGNOSIS — Z6836 Body mass index (BMI) 36.0-36.9, adult: Secondary | ICD-10-CM | POA: Diagnosis not present

## 2022-12-09 DIAGNOSIS — Z6836 Body mass index (BMI) 36.0-36.9, adult: Secondary | ICD-10-CM | POA: Diagnosis not present

## 2022-12-09 DIAGNOSIS — E1122 Type 2 diabetes mellitus with diabetic chronic kidney disease: Secondary | ICD-10-CM | POA: Diagnosis not present

## 2022-12-09 DIAGNOSIS — E113293 Type 2 diabetes mellitus with mild nonproliferative diabetic retinopathy without macular edema, bilateral: Secondary | ICD-10-CM | POA: Diagnosis not present

## 2022-12-09 DIAGNOSIS — N1831 Chronic kidney disease, stage 3a: Secondary | ICD-10-CM | POA: Diagnosis not present

## 2022-12-09 DIAGNOSIS — E782 Mixed hyperlipidemia: Secondary | ICD-10-CM | POA: Diagnosis not present

## 2022-12-09 DIAGNOSIS — I129 Hypertensive chronic kidney disease with stage 1 through stage 4 chronic kidney disease, or unspecified chronic kidney disease: Secondary | ICD-10-CM | POA: Diagnosis not present

## 2023-01-28 DIAGNOSIS — D225 Melanocytic nevi of trunk: Secondary | ICD-10-CM | POA: Diagnosis not present

## 2023-01-28 DIAGNOSIS — L570 Actinic keratosis: Secondary | ICD-10-CM | POA: Diagnosis not present

## 2023-01-28 DIAGNOSIS — L814 Other melanin hyperpigmentation: Secondary | ICD-10-CM | POA: Diagnosis not present

## 2023-01-28 DIAGNOSIS — L578 Other skin changes due to chronic exposure to nonionizing radiation: Secondary | ICD-10-CM | POA: Diagnosis not present

## 2023-01-28 DIAGNOSIS — L821 Other seborrheic keratosis: Secondary | ICD-10-CM | POA: Diagnosis not present

## 2023-03-15 DIAGNOSIS — E1122 Type 2 diabetes mellitus with diabetic chronic kidney disease: Secondary | ICD-10-CM | POA: Diagnosis not present

## 2023-03-15 DIAGNOSIS — E113293 Type 2 diabetes mellitus with mild nonproliferative diabetic retinopathy without macular edema, bilateral: Secondary | ICD-10-CM | POA: Diagnosis not present

## 2023-03-15 DIAGNOSIS — Z8673 Personal history of transient ischemic attack (TIA), and cerebral infarction without residual deficits: Secondary | ICD-10-CM | POA: Diagnosis not present

## 2023-03-15 DIAGNOSIS — Z9181 History of falling: Secondary | ICD-10-CM | POA: Diagnosis not present

## 2023-03-15 DIAGNOSIS — E782 Mixed hyperlipidemia: Secondary | ICD-10-CM | POA: Diagnosis not present

## 2023-03-15 DIAGNOSIS — I7789 Other specified disorders of arteries and arterioles: Secondary | ICD-10-CM | POA: Diagnosis not present

## 2023-03-15 DIAGNOSIS — N1831 Chronic kidney disease, stage 3a: Secondary | ICD-10-CM | POA: Diagnosis not present

## 2023-03-15 DIAGNOSIS — I129 Hypertensive chronic kidney disease with stage 1 through stage 4 chronic kidney disease, or unspecified chronic kidney disease: Secondary | ICD-10-CM | POA: Diagnosis not present

## 2023-03-15 DIAGNOSIS — Z6836 Body mass index (BMI) 36.0-36.9, adult: Secondary | ICD-10-CM | POA: Diagnosis not present

## 2023-06-14 DIAGNOSIS — E782 Mixed hyperlipidemia: Secondary | ICD-10-CM | POA: Diagnosis not present

## 2023-06-14 DIAGNOSIS — E1122 Type 2 diabetes mellitus with diabetic chronic kidney disease: Secondary | ICD-10-CM | POA: Diagnosis not present

## 2023-06-14 DIAGNOSIS — N1831 Chronic kidney disease, stage 3a: Secondary | ICD-10-CM | POA: Diagnosis not present

## 2023-06-14 DIAGNOSIS — Z139 Encounter for screening, unspecified: Secondary | ICD-10-CM | POA: Diagnosis not present

## 2023-06-14 DIAGNOSIS — E113293 Type 2 diabetes mellitus with mild nonproliferative diabetic retinopathy without macular edema, bilateral: Secondary | ICD-10-CM | POA: Diagnosis not present

## 2023-06-14 DIAGNOSIS — I129 Hypertensive chronic kidney disease with stage 1 through stage 4 chronic kidney disease, or unspecified chronic kidney disease: Secondary | ICD-10-CM | POA: Diagnosis not present

## 2023-06-14 DIAGNOSIS — Z6836 Body mass index (BMI) 36.0-36.9, adult: Secondary | ICD-10-CM | POA: Diagnosis not present

## 2023-06-14 DIAGNOSIS — Z8673 Personal history of transient ischemic attack (TIA), and cerebral infarction without residual deficits: Secondary | ICD-10-CM | POA: Diagnosis not present

## 2023-06-14 DIAGNOSIS — Z2821 Immunization not carried out because of patient refusal: Secondary | ICD-10-CM | POA: Diagnosis not present

## 2023-06-28 DIAGNOSIS — L570 Actinic keratosis: Secondary | ICD-10-CM | POA: Diagnosis not present

## 2023-06-28 DIAGNOSIS — L820 Inflamed seborrheic keratosis: Secondary | ICD-10-CM | POA: Diagnosis not present

## 2023-08-19 DIAGNOSIS — E875 Hyperkalemia: Secondary | ICD-10-CM | POA: Diagnosis not present

## 2023-09-20 DIAGNOSIS — Z8673 Personal history of transient ischemic attack (TIA), and cerebral infarction without residual deficits: Secondary | ICD-10-CM | POA: Diagnosis not present

## 2023-09-20 DIAGNOSIS — D75838 Other thrombocytosis: Secondary | ICD-10-CM | POA: Diagnosis not present

## 2023-09-20 DIAGNOSIS — E1122 Type 2 diabetes mellitus with diabetic chronic kidney disease: Secondary | ICD-10-CM | POA: Diagnosis not present

## 2023-09-20 DIAGNOSIS — M109 Gout, unspecified: Secondary | ICD-10-CM | POA: Diagnosis not present

## 2023-09-20 DIAGNOSIS — E782 Mixed hyperlipidemia: Secondary | ICD-10-CM | POA: Diagnosis not present

## 2023-09-20 DIAGNOSIS — Z6837 Body mass index (BMI) 37.0-37.9, adult: Secondary | ICD-10-CM | POA: Diagnosis not present

## 2023-09-20 DIAGNOSIS — D72829 Elevated white blood cell count, unspecified: Secondary | ICD-10-CM | POA: Diagnosis not present

## 2023-09-20 DIAGNOSIS — I7789 Other specified disorders of arteries and arterioles: Secondary | ICD-10-CM | POA: Diagnosis not present

## 2023-09-20 DIAGNOSIS — Z9081 Acquired absence of spleen: Secondary | ICD-10-CM | POA: Diagnosis not present

## 2023-09-20 DIAGNOSIS — E113293 Type 2 diabetes mellitus with mild nonproliferative diabetic retinopathy without macular edema, bilateral: Secondary | ICD-10-CM | POA: Diagnosis not present

## 2023-09-20 DIAGNOSIS — I129 Hypertensive chronic kidney disease with stage 1 through stage 4 chronic kidney disease, or unspecified chronic kidney disease: Secondary | ICD-10-CM | POA: Diagnosis not present

## 2023-09-20 DIAGNOSIS — N1831 Chronic kidney disease, stage 3a: Secondary | ICD-10-CM | POA: Diagnosis not present

## 2023-09-20 DIAGNOSIS — J0100 Acute maxillary sinusitis, unspecified: Secondary | ICD-10-CM | POA: Diagnosis not present

## 2023-10-29 DIAGNOSIS — Z7902 Long term (current) use of antithrombotics/antiplatelets: Secondary | ICD-10-CM | POA: Diagnosis not present

## 2023-10-29 DIAGNOSIS — E669 Obesity, unspecified: Secondary | ICD-10-CM | POA: Diagnosis not present

## 2023-10-29 DIAGNOSIS — N179 Acute kidney failure, unspecified: Secondary | ICD-10-CM | POA: Diagnosis not present

## 2023-10-29 DIAGNOSIS — R079 Chest pain, unspecified: Secondary | ICD-10-CM | POA: Diagnosis not present

## 2023-10-29 DIAGNOSIS — M25562 Pain in left knee: Secondary | ICD-10-CM | POA: Diagnosis not present

## 2023-10-29 DIAGNOSIS — I10 Essential (primary) hypertension: Secondary | ICD-10-CM | POA: Diagnosis not present

## 2023-10-29 DIAGNOSIS — Z79899 Other long term (current) drug therapy: Secondary | ICD-10-CM | POA: Diagnosis not present

## 2023-10-29 DIAGNOSIS — E785 Hyperlipidemia, unspecified: Secondary | ICD-10-CM | POA: Diagnosis not present

## 2023-10-29 DIAGNOSIS — R0789 Other chest pain: Secondary | ICD-10-CM | POA: Diagnosis not present

## 2023-10-29 DIAGNOSIS — Z85038 Personal history of other malignant neoplasm of large intestine: Secondary | ICD-10-CM | POA: Diagnosis not present

## 2023-10-29 DIAGNOSIS — R7303 Prediabetes: Secondary | ICD-10-CM | POA: Diagnosis not present

## 2023-10-29 DIAGNOSIS — M79605 Pain in left leg: Secondary | ICD-10-CM | POA: Diagnosis not present

## 2023-10-29 DIAGNOSIS — E875 Hyperkalemia: Secondary | ICD-10-CM | POA: Diagnosis not present

## 2023-10-30 DIAGNOSIS — R079 Chest pain, unspecified: Secondary | ICD-10-CM | POA: Diagnosis not present

## 2023-10-30 DIAGNOSIS — N179 Acute kidney failure, unspecified: Secondary | ICD-10-CM | POA: Diagnosis not present

## 2023-10-30 DIAGNOSIS — I10 Essential (primary) hypertension: Secondary | ICD-10-CM | POA: Diagnosis not present

## 2023-10-31 DIAGNOSIS — I10 Essential (primary) hypertension: Secondary | ICD-10-CM | POA: Diagnosis not present

## 2023-10-31 DIAGNOSIS — N179 Acute kidney failure, unspecified: Secondary | ICD-10-CM | POA: Diagnosis not present

## 2023-10-31 DIAGNOSIS — R079 Chest pain, unspecified: Secondary | ICD-10-CM | POA: Diagnosis not present

## 2023-11-09 DIAGNOSIS — L820 Inflamed seborrheic keratosis: Secondary | ICD-10-CM | POA: Diagnosis not present

## 2023-11-09 DIAGNOSIS — N179 Acute kidney failure, unspecified: Secondary | ICD-10-CM | POA: Diagnosis not present

## 2023-11-09 DIAGNOSIS — L821 Other seborrheic keratosis: Secondary | ICD-10-CM | POA: Diagnosis not present

## 2023-11-09 DIAGNOSIS — D2239 Melanocytic nevi of other parts of face: Secondary | ICD-10-CM | POA: Diagnosis not present

## 2023-11-09 DIAGNOSIS — D225 Melanocytic nevi of trunk: Secondary | ICD-10-CM | POA: Diagnosis not present

## 2023-11-09 DIAGNOSIS — Z6838 Body mass index (BMI) 38.0-38.9, adult: Secondary | ICD-10-CM | POA: Diagnosis not present

## 2023-11-09 DIAGNOSIS — Z79899 Other long term (current) drug therapy: Secondary | ICD-10-CM | POA: Diagnosis not present

## 2023-11-09 DIAGNOSIS — R079 Chest pain, unspecified: Secondary | ICD-10-CM | POA: Diagnosis not present

## 2023-11-09 DIAGNOSIS — E875 Hyperkalemia: Secondary | ICD-10-CM | POA: Diagnosis not present

## 2024-05-04 ENCOUNTER — Other Ambulatory Visit: Payer: Self-pay

## 2024-05-04 DIAGNOSIS — S2500XS Unspecified injury of thoracic aorta, sequela: Secondary | ICD-10-CM

## 2024-05-18 ENCOUNTER — Encounter (HOSPITAL_BASED_OUTPATIENT_CLINIC_OR_DEPARTMENT_OTHER): Payer: Self-pay

## 2024-05-18 ENCOUNTER — Other Ambulatory Visit: Payer: Self-pay

## 2024-05-18 DIAGNOSIS — I77810 Thoracic aortic ectasia: Secondary | ICD-10-CM

## 2024-05-30 ENCOUNTER — Other Ambulatory Visit (HOSPITAL_BASED_OUTPATIENT_CLINIC_OR_DEPARTMENT_OTHER): Admitting: Radiology

## 2024-06-07 ENCOUNTER — Ambulatory Visit (INDEPENDENT_AMBULATORY_CARE_PROVIDER_SITE_OTHER)
Admission: RE | Admit: 2024-06-07 | Discharge: 2024-06-07 | Disposition: A | Discharge status: Home / Self Care | Source: Ambulatory Visit

## 2024-06-07 DIAGNOSIS — I77810 Thoracic aortic ectasia: Secondary | ICD-10-CM | POA: Diagnosis not present

## 2024-06-07 LAB — I-STAT CREATININE (MANUAL ENTRY): Creatinine, Ser: 1.5 — AB (ref 0.50–1.10)

## 2024-06-07 MED ORDER — IOHEXOL 350 MG/ML SOLN
100.0000 mL | Freq: Once | INTRAVENOUS | Status: AC | PRN
  Administered 2024-06-07: 75 mL via INTRAVENOUS
